# Patient Record
Sex: Male | Born: 1950 | Race: White | Hispanic: No | Smoking: Current every day smoker
Health system: Southern US, Community
[De-identification: ages and names within clinical notes are randomized; demographics above are authoritative.]

## PROBLEM LIST (undated history)

## (undated) DIAGNOSIS — I509 Heart failure, unspecified: Secondary | ICD-10-CM

## (undated) DIAGNOSIS — I4891 Unspecified atrial fibrillation: Secondary | ICD-10-CM

## (undated) DIAGNOSIS — I25810 Atherosclerosis of coronary artery bypass graft(s) without angina pectoris: Secondary | ICD-10-CM

## (undated) DIAGNOSIS — Z95810 Presence of automatic (implantable) cardiac defibrillator: Secondary | ICD-10-CM

## (undated) DIAGNOSIS — E785 Hyperlipidemia, unspecified: Secondary | ICD-10-CM

## (undated) DIAGNOSIS — R000 Tachycardia, unspecified: Secondary | ICD-10-CM

## (undated) DIAGNOSIS — I255 Ischemic cardiomyopathy: Secondary | ICD-10-CM

## (undated) HISTORY — PX: APPENDECTOMY: SHX54

## (undated) HISTORY — DX: Heart failure, unspecified: I50.9

## (undated) HISTORY — DX: Presence of automatic (implantable) cardiac defibrillator: Z95.810

## (undated) HISTORY — DX: Tachycardia, unspecified: R00.0

## (undated) HISTORY — PX: LAPAROTOMY: SHX154

## (undated) HISTORY — DX: Ischemic cardiomyopathy: I25.5

## (undated) HISTORY — DX: Hyperlipidemia, unspecified: E78.5

## (undated) HISTORY — DX: Unspecified atrial fibrillation: I48.91

## (undated) HISTORY — DX: Atherosclerosis of coronary artery bypass graft(s) without angina pectoris: I25.810

---

## 1998-09-13 ENCOUNTER — Encounter: Admission: RE | Admit: 1998-09-13 | Discharge: 1998-12-12 | Payer: Self-pay | Admitting: *Deleted

## 2001-07-20 ENCOUNTER — Encounter: Admission: RE | Admit: 2001-07-20 | Discharge: 2001-07-20 | Payer: Self-pay | Admitting: Internal Medicine

## 2001-07-20 ENCOUNTER — Encounter: Payer: Self-pay | Admitting: Internal Medicine

## 2002-07-03 ENCOUNTER — Encounter (HOSPITAL_COMMUNITY): Admission: RE | Admit: 2002-07-03 | Discharge: 2002-10-01 | Payer: Self-pay | Admitting: Internal Medicine

## 2002-07-04 ENCOUNTER — Encounter: Payer: Self-pay | Admitting: Internal Medicine

## 2005-05-17 HISTORY — PX: CORONARY ARTERY BYPASS GRAFT: SHX141

## 2005-06-06 ENCOUNTER — Other Ambulatory Visit: Payer: Self-pay

## 2005-06-07 ENCOUNTER — Other Ambulatory Visit: Payer: Self-pay

## 2005-06-07 ENCOUNTER — Inpatient Hospital Stay: Payer: Self-pay | Admitting: Internal Medicine

## 2005-06-09 ENCOUNTER — Ambulatory Visit: Payer: Self-pay | Admitting: Cardiology

## 2005-06-09 ENCOUNTER — Encounter (INDEPENDENT_AMBULATORY_CARE_PROVIDER_SITE_OTHER): Payer: Self-pay | Admitting: *Deleted

## 2005-06-09 ENCOUNTER — Encounter: Payer: Self-pay | Admitting: Cardiology

## 2005-06-09 ENCOUNTER — Inpatient Hospital Stay (HOSPITAL_COMMUNITY)
Admission: AD | Admit: 2005-06-09 | Discharge: 2005-06-24 | Payer: Self-pay | Admitting: Thoracic Surgery (Cardiothoracic Vascular Surgery)

## 2005-06-23 ENCOUNTER — Encounter: Payer: Self-pay | Admitting: Cardiology

## 2005-07-02 ENCOUNTER — Ambulatory Visit: Payer: Self-pay | Admitting: Cardiovascular Disease

## 2005-07-02 ENCOUNTER — Ambulatory Visit: Payer: Self-pay | Admitting: Cardiology

## 2005-07-09 ENCOUNTER — Ambulatory Visit: Payer: Self-pay | Admitting: Cardiology

## 2005-07-10 ENCOUNTER — Ambulatory Visit: Payer: Self-pay | Admitting: Cardiology

## 2005-07-14 ENCOUNTER — Ambulatory Visit: Payer: Self-pay | Admitting: Cardiovascular Disease

## 2005-07-14 ENCOUNTER — Ambulatory Visit (HOSPITAL_COMMUNITY): Admission: RE | Admit: 2005-07-14 | Discharge: 2005-07-14 | Payer: Self-pay | Admitting: Cardiovascular Disease

## 2005-07-17 ENCOUNTER — Ambulatory Visit: Payer: Self-pay | Admitting: Cardiology

## 2005-07-17 ENCOUNTER — Ambulatory Visit: Payer: Self-pay | Admitting: Cardiovascular Disease

## 2005-07-30 ENCOUNTER — Encounter (HOSPITAL_COMMUNITY): Admission: RE | Admit: 2005-07-30 | Discharge: 2005-10-28 | Payer: Self-pay | Admitting: Cardiovascular Disease

## 2005-08-11 ENCOUNTER — Ambulatory Visit: Payer: Self-pay | Admitting: Cardiovascular Disease

## 2005-08-13 ENCOUNTER — Encounter: Payer: Self-pay | Admitting: Cardiology

## 2005-08-13 ENCOUNTER — Ambulatory Visit (HOSPITAL_COMMUNITY): Admission: RE | Admit: 2005-08-13 | Discharge: 2005-08-13 | Payer: Self-pay | Admitting: Cardiovascular Disease

## 2005-09-04 ENCOUNTER — Ambulatory Visit: Payer: Self-pay | Admitting: Internal Medicine

## 2005-09-15 ENCOUNTER — Ambulatory Visit: Payer: Self-pay | Admitting: Cardiology

## 2005-09-16 HISTORY — PX: OTHER SURGICAL HISTORY: SHX169

## 2005-09-21 ENCOUNTER — Ambulatory Visit: Payer: Self-pay | Admitting: Internal Medicine

## 2005-09-21 ENCOUNTER — Ambulatory Visit (HOSPITAL_COMMUNITY): Admission: RE | Admit: 2005-09-21 | Discharge: 2005-09-22 | Payer: Self-pay | Admitting: Internal Medicine

## 2005-10-01 ENCOUNTER — Ambulatory Visit: Payer: Self-pay | Admitting: Cardiology

## 2005-10-01 ENCOUNTER — Ambulatory Visit: Payer: Self-pay | Admitting: Cardiovascular Disease

## 2005-10-12 ENCOUNTER — Ambulatory Visit: Payer: Self-pay

## 2005-10-29 ENCOUNTER — Encounter (HOSPITAL_COMMUNITY): Admission: RE | Admit: 2005-10-29 | Discharge: 2006-01-27 | Payer: Self-pay | Admitting: Cardiovascular Disease

## 2006-01-05 ENCOUNTER — Ambulatory Visit: Payer: Self-pay | Admitting: Internal Medicine

## 2006-01-14 ENCOUNTER — Ambulatory Visit: Payer: Self-pay | Admitting: Cardiovascular Disease

## 2006-04-06 ENCOUNTER — Ambulatory Visit: Payer: Self-pay | Admitting: Internal Medicine

## 2006-04-30 ENCOUNTER — Ambulatory Visit: Payer: Self-pay | Admitting: Cardiovascular Disease

## 2006-06-11 ENCOUNTER — Ambulatory Visit: Payer: Self-pay | Admitting: Internal Medicine

## 2006-06-11 ENCOUNTER — Ambulatory Visit: Payer: Self-pay | Admitting: Cardiology

## 2006-06-14 ENCOUNTER — Ambulatory Visit: Payer: Self-pay | Admitting: Cardiology

## 2006-06-14 ENCOUNTER — Ambulatory Visit: Payer: Self-pay | Admitting: Internal Medicine

## 2006-06-16 ENCOUNTER — Ambulatory Visit: Payer: Self-pay | Admitting: Cardiology

## 2006-06-16 ENCOUNTER — Ambulatory Visit: Payer: Self-pay | Admitting: Internal Medicine

## 2006-06-16 LAB — CONVERTED CEMR LAB
Basophils Absolute: 0 10*3/uL (ref 0.0–0.1)
Basophils Relative: 0.2 % (ref 0.0–1.0)
Calcium: 9.3 mg/dL (ref 8.4–10.5)
Creatinine, Ser: 1.1 mg/dL (ref 0.4–1.5)
GFR calc Af Amer: 89 mL/min
INR: 1.2 (ref 0.9–2.0)
Lymphocytes Relative: 14.6 % (ref 12.0–46.0)
Monocytes Relative: 4.9 % (ref 3.0–11.0)
Platelets: 307 10*3/uL (ref 150–400)
RBC: 4.55 M/uL (ref 4.22–5.81)
WBC: 14.9 10*3/uL — ABNORMAL HIGH (ref 4.5–10.5)

## 2006-06-18 ENCOUNTER — Encounter: Payer: Self-pay | Admitting: Internal Medicine

## 2006-06-18 ENCOUNTER — Ambulatory Visit: Payer: Self-pay | Admitting: Internal Medicine

## 2006-06-18 ENCOUNTER — Ambulatory Visit (HOSPITAL_COMMUNITY): Admission: RE | Admit: 2006-06-18 | Discharge: 2006-06-19 | Payer: Self-pay | Admitting: Internal Medicine

## 2006-06-22 ENCOUNTER — Ambulatory Visit: Payer: Self-pay | Admitting: Internal Medicine

## 2006-07-01 ENCOUNTER — Ambulatory Visit: Payer: Self-pay | Admitting: Cardiovascular Disease

## 2006-07-14 ENCOUNTER — Ambulatory Visit: Payer: Self-pay | Admitting: Internal Medicine

## 2006-08-09 ENCOUNTER — Ambulatory Visit: Payer: Self-pay | Admitting: Cardiovascular Disease

## 2006-08-17 ENCOUNTER — Ambulatory Visit: Payer: Self-pay | Admitting: Cardiology

## 2006-08-24 IMAGING — CR DG CHEST 1V PORT
1 series · 1 of 1 positions shown · non-contrast
Comparison: 06/12/05
 AP film at 0090 hours shows interval removal of the left chest tube, the mediastinal drains, the nasogastric tube, and the endotracheal tube, and the pulmonary artery catheter.

CLINICAL DATA: CABG. 
 PORTABLE CHEST- 1 VIEW:

[view not recorded]
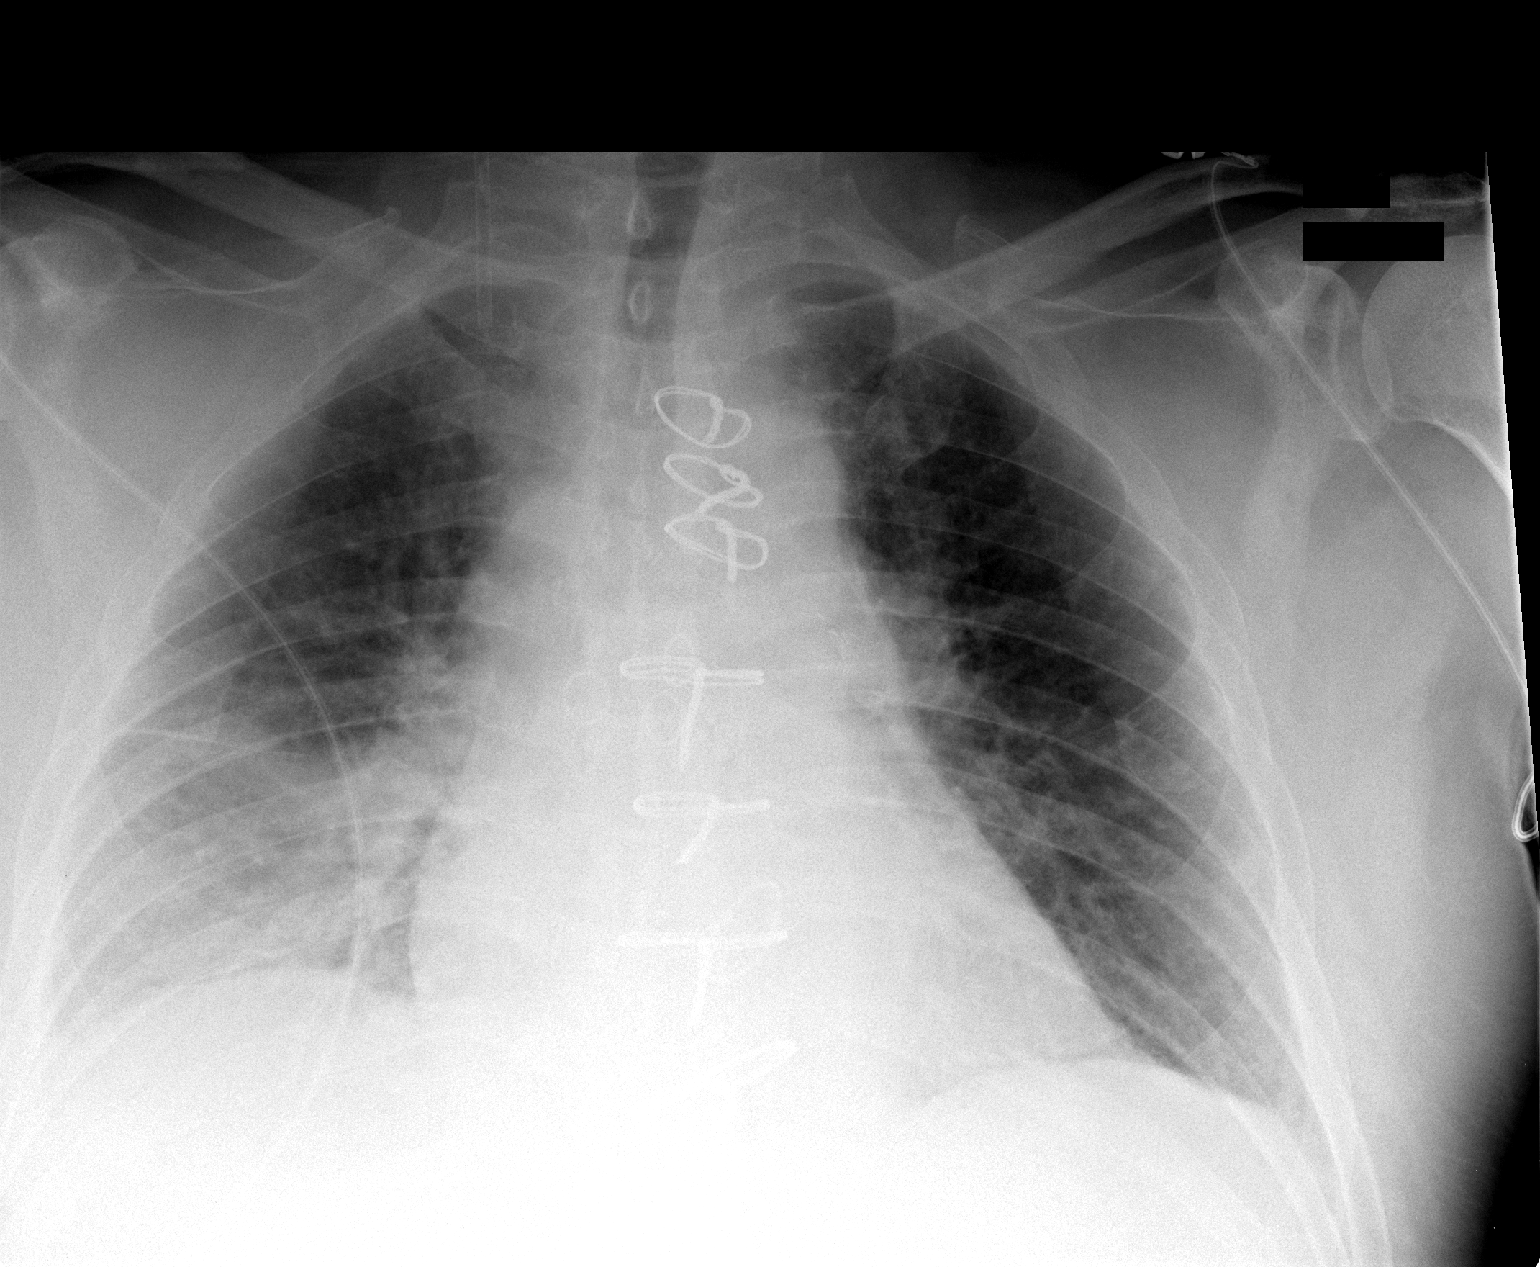

[1 of 1 positions shown; findings below may reference images not displayed]

Right IJ sheath remains in place and may be kinked.  
 Cardiopericardial silhouette is stable.  There is atelectasis at the right base.
IMPRESSION: 1.  Status post extubation with removal of multiple support apparatus.  
 2.  Right base atelectasis.

## 2006-09-03 ENCOUNTER — Ambulatory Visit: Payer: Self-pay | Admitting: Internal Medicine

## 2006-09-24 ENCOUNTER — Ambulatory Visit: Payer: Self-pay | Admitting: Cardiology

## 2006-10-13 ENCOUNTER — Ambulatory Visit: Payer: Self-pay | Admitting: Internal Medicine

## 2006-11-10 ENCOUNTER — Ambulatory Visit: Payer: Self-pay | Admitting: Cardiology

## 2006-12-01 ENCOUNTER — Ambulatory Visit: Payer: Self-pay | Admitting: Cardiology

## 2006-12-07 ENCOUNTER — Ambulatory Visit: Payer: Self-pay | Admitting: Internal Medicine

## 2006-12-29 ENCOUNTER — Ambulatory Visit: Payer: Self-pay | Admitting: Cardiology

## 2007-01-26 ENCOUNTER — Ambulatory Visit: Payer: Self-pay | Admitting: Cardiology

## 2007-03-02 ENCOUNTER — Ambulatory Visit: Payer: Self-pay | Admitting: Internal Medicine

## 2007-03-30 ENCOUNTER — Ambulatory Visit: Payer: Self-pay | Admitting: Cardiovascular Disease

## 2007-05-04 ENCOUNTER — Ambulatory Visit: Payer: Self-pay | Admitting: Cardiology

## 2007-06-01 ENCOUNTER — Ambulatory Visit: Payer: Self-pay | Admitting: Internal Medicine

## 2007-06-30 ENCOUNTER — Ambulatory Visit: Payer: Self-pay | Admitting: Cardiology

## 2007-07-08 ENCOUNTER — Ambulatory Visit: Payer: Self-pay | Admitting: Internal Medicine

## 2007-07-20 ENCOUNTER — Ambulatory Visit: Payer: Self-pay | Admitting: Cardiovascular Disease

## 2007-07-20 ENCOUNTER — Ambulatory Visit (HOSPITAL_COMMUNITY): Admission: RE | Admit: 2007-07-20 | Discharge: 2007-07-20 | Payer: Self-pay | Admitting: Cardiovascular Disease

## 2007-08-10 ENCOUNTER — Ambulatory Visit: Payer: Self-pay | Admitting: Cardiovascular Disease

## 2007-09-06 ENCOUNTER — Ambulatory Visit: Payer: Self-pay | Admitting: Internal Medicine

## 2007-09-06 ENCOUNTER — Ambulatory Visit: Payer: Self-pay | Admitting: Cardiology

## 2007-10-04 ENCOUNTER — Ambulatory Visit: Payer: Self-pay | Admitting: Cardiology

## 2007-10-26 ENCOUNTER — Ambulatory Visit: Payer: Self-pay | Admitting: Cardiovascular Disease

## 2007-11-23 ENCOUNTER — Ambulatory Visit: Payer: Self-pay | Admitting: Cardiology

## 2007-12-06 ENCOUNTER — Ambulatory Visit: Payer: Self-pay | Admitting: Internal Medicine

## 2007-12-21 ENCOUNTER — Ambulatory Visit: Payer: Self-pay | Admitting: Cardiology

## 2008-01-03 ENCOUNTER — Ambulatory Visit: Payer: Self-pay | Admitting: Cardiology

## 2008-01-03 ENCOUNTER — Ambulatory Visit: Payer: Self-pay | Admitting: Cardiovascular Disease

## 2008-01-03 LAB — CONVERTED CEMR LAB
AST: 19 units/L (ref 0–37)
Albumin: 3.9 g/dL (ref 3.5–5.2)
BUN: 16 mg/dL (ref 6–23)
Chloride: 102 meq/L (ref 96–112)
GFR calc non Af Amer: 73 mL/min
Potassium: 4.5 meq/L (ref 3.5–5.1)
Sodium: 139 meq/L (ref 135–145)
TSH: 0.31 microintl units/mL — ABNORMAL LOW (ref 0.35–5.50)
Total Bilirubin: 0.7 mg/dL (ref 0.3–1.2)
Total Protein: 7.8 g/dL (ref 6.0–8.3)

## 2008-01-10 ENCOUNTER — Ambulatory Visit: Admission: RE | Admit: 2008-01-10 | Discharge: 2008-01-10 | Payer: Self-pay | Admitting: Cardiovascular Disease

## 2008-01-31 ENCOUNTER — Ambulatory Visit: Payer: Self-pay | Admitting: Cardiology

## 2008-02-28 ENCOUNTER — Ambulatory Visit: Payer: Self-pay | Admitting: Cardiology

## 2008-04-04 ENCOUNTER — Ambulatory Visit: Payer: Self-pay | Admitting: Cardiovascular Disease

## 2008-04-04 ENCOUNTER — Ambulatory Visit: Payer: Self-pay | Admitting: Cardiology

## 2008-04-04 LAB — CONVERTED CEMR LAB: TSH: 0.46 microintl units/mL (ref 0.35–5.50)

## 2008-04-24 ENCOUNTER — Encounter: Payer: Self-pay | Admitting: Internal Medicine

## 2008-05-01 ENCOUNTER — Ambulatory Visit: Payer: Self-pay | Admitting: Internal Medicine

## 2008-05-02 ENCOUNTER — Ambulatory Visit: Payer: Self-pay | Admitting: Cardiology

## 2008-05-30 ENCOUNTER — Ambulatory Visit: Payer: Self-pay | Admitting: Cardiovascular Disease

## 2008-06-12 ENCOUNTER — Encounter: Payer: Self-pay | Admitting: Cardiovascular Disease

## 2008-06-13 ENCOUNTER — Encounter: Payer: Self-pay | Admitting: Cardiovascular Disease

## 2008-06-19 ENCOUNTER — Encounter: Payer: Self-pay | Admitting: Cardiovascular Disease

## 2008-06-20 ENCOUNTER — Encounter: Payer: Self-pay | Admitting: Cardiovascular Disease

## 2008-06-21 ENCOUNTER — Encounter: Payer: Self-pay | Admitting: Cardiovascular Disease

## 2008-06-22 ENCOUNTER — Ambulatory Visit: Payer: Self-pay | Admitting: Cardiovascular Disease

## 2008-06-28 ENCOUNTER — Encounter: Payer: Self-pay | Admitting: Cardiovascular Disease

## 2008-07-02 DIAGNOSIS — I509 Heart failure, unspecified: Secondary | ICD-10-CM | POA: Insufficient documentation

## 2008-07-02 DIAGNOSIS — Z95810 Presence of automatic (implantable) cardiac defibrillator: Secondary | ICD-10-CM | POA: Insufficient documentation

## 2008-07-02 DIAGNOSIS — I4891 Unspecified atrial fibrillation: Secondary | ICD-10-CM

## 2008-07-02 DIAGNOSIS — R0989 Other specified symptoms and signs involving the circulatory and respiratory systems: Secondary | ICD-10-CM | POA: Insufficient documentation

## 2008-07-02 DIAGNOSIS — M129 Arthropathy, unspecified: Secondary | ICD-10-CM | POA: Insufficient documentation

## 2008-07-02 DIAGNOSIS — I2589 Other forms of chronic ischemic heart disease: Secondary | ICD-10-CM

## 2008-07-02 DIAGNOSIS — E119 Type 2 diabetes mellitus without complications: Secondary | ICD-10-CM | POA: Insufficient documentation

## 2008-07-02 DIAGNOSIS — I25810 Atherosclerosis of coronary artery bypass graft(s) without angina pectoris: Secondary | ICD-10-CM

## 2008-07-02 DIAGNOSIS — E785 Hyperlipidemia, unspecified: Secondary | ICD-10-CM

## 2008-07-16 ENCOUNTER — Encounter: Payer: Self-pay | Admitting: Cardiovascular Disease

## 2008-07-17 ENCOUNTER — Encounter: Payer: Self-pay | Admitting: *Deleted

## 2008-07-18 ENCOUNTER — Encounter: Payer: Self-pay | Admitting: Cardiovascular Disease

## 2008-07-19 ENCOUNTER — Encounter (INDEPENDENT_AMBULATORY_CARE_PROVIDER_SITE_OTHER): Payer: Self-pay | Admitting: *Deleted

## 2008-07-23 ENCOUNTER — Ambulatory Visit: Payer: Self-pay | Admitting: Cardiovascular Disease

## 2008-07-23 LAB — CONVERTED CEMR LAB
POC INR: 2
Protime: 17.6

## 2008-07-29 ENCOUNTER — Encounter: Payer: Self-pay | Admitting: Cardiovascular Disease

## 2008-08-13 ENCOUNTER — Ambulatory Visit: Payer: Self-pay | Admitting: Cardiovascular Disease

## 2008-08-13 LAB — CONVERTED CEMR LAB
POC INR: 1.3
Prothrombin Time: 14.4 s

## 2008-08-22 ENCOUNTER — Encounter: Payer: Self-pay | Admitting: *Deleted

## 2008-08-24 ENCOUNTER — Ambulatory Visit: Payer: Self-pay | Admitting: Cardiology

## 2008-08-24 ENCOUNTER — Encounter (INDEPENDENT_AMBULATORY_CARE_PROVIDER_SITE_OTHER): Payer: Self-pay | Admitting: Cardiology

## 2008-08-24 LAB — CONVERTED CEMR LAB: POC INR: 2

## 2008-08-29 ENCOUNTER — Encounter: Payer: Self-pay | Admitting: Cardiovascular Disease

## 2008-09-25 ENCOUNTER — Encounter: Payer: Self-pay | Admitting: Cardiology

## 2008-09-28 ENCOUNTER — Encounter: Payer: Self-pay | Admitting: Cardiovascular Disease

## 2008-10-03 ENCOUNTER — Encounter (INDEPENDENT_AMBULATORY_CARE_PROVIDER_SITE_OTHER): Payer: Self-pay | Admitting: *Deleted

## 2008-10-15 ENCOUNTER — Ambulatory Visit: Payer: Self-pay | Admitting: Cardiovascular Disease

## 2008-10-18 ENCOUNTER — Encounter: Payer: Self-pay | Admitting: Cardiovascular Disease

## 2008-10-19 ENCOUNTER — Encounter (INDEPENDENT_AMBULATORY_CARE_PROVIDER_SITE_OTHER): Payer: Self-pay | Admitting: *Deleted

## 2008-10-19 LAB — CONVERTED CEMR LAB
ALT: 23 units/L (ref 0–53)
AST: 18 units/L (ref 0–37)
Albumin: 3.6 g/dL (ref 3.5–5.2)
CO2: 28 meq/L (ref 19–32)
Calcium: 8.8 mg/dL (ref 8.4–10.5)
Chloride: 109 meq/L (ref 96–112)
Creatinine, Ser: 1 mg/dL (ref 0.4–1.5)
Free T4: 1.4 ng/dL (ref 0.6–1.6)
Glucose, Bld: 59 mg/dL — ABNORMAL LOW (ref 70–99)
Potassium: 4.1 meq/L (ref 3.5–5.1)
Pro B Natriuretic peptide (BNP): 350 pg/mL — ABNORMAL HIGH (ref 0.0–100.0)

## 2008-10-22 ENCOUNTER — Encounter: Payer: Self-pay | Admitting: Cardiovascular Disease

## 2008-10-30 ENCOUNTER — Ambulatory Visit: Payer: Self-pay | Admitting: Cardiology

## 2008-10-30 LAB — CONVERTED CEMR LAB: POC INR: 2.3

## 2008-11-05 ENCOUNTER — Ambulatory Visit: Payer: Self-pay

## 2008-11-05 ENCOUNTER — Ambulatory Visit: Payer: Self-pay | Admitting: Cardiovascular Disease

## 2008-11-05 ENCOUNTER — Encounter: Payer: Self-pay | Admitting: Cardiovascular Disease

## 2008-11-08 ENCOUNTER — Encounter (INDEPENDENT_AMBULATORY_CARE_PROVIDER_SITE_OTHER): Payer: Self-pay | Admitting: *Deleted

## 2008-11-20 ENCOUNTER — Encounter: Payer: Self-pay | Admitting: Cardiovascular Disease

## 2008-11-27 ENCOUNTER — Ambulatory Visit: Payer: Self-pay | Admitting: Cardiology

## 2008-11-27 LAB — CONVERTED CEMR LAB: POC INR: 1.8

## 2008-12-09 ENCOUNTER — Encounter: Payer: Self-pay | Admitting: Cardiovascular Disease

## 2008-12-16 ENCOUNTER — Encounter: Payer: Self-pay | Admitting: Cardiovascular Disease

## 2008-12-17 ENCOUNTER — Encounter: Payer: Self-pay | Admitting: Cardiovascular Disease

## 2009-01-13 ENCOUNTER — Encounter: Payer: Self-pay | Admitting: Cardiovascular Disease

## 2009-01-21 ENCOUNTER — Encounter: Payer: Self-pay | Admitting: Cardiovascular Disease

## 2009-02-06 ENCOUNTER — Encounter: Payer: Self-pay | Admitting: Internal Medicine

## 2009-02-12 ENCOUNTER — Encounter (INDEPENDENT_AMBULATORY_CARE_PROVIDER_SITE_OTHER): Payer: Self-pay | Admitting: *Deleted

## 2009-02-12 ENCOUNTER — Ambulatory Visit: Payer: Self-pay | Admitting: Cardiovascular Disease

## 2009-03-13 ENCOUNTER — Encounter (INDEPENDENT_AMBULATORY_CARE_PROVIDER_SITE_OTHER): Payer: Self-pay | Admitting: *Deleted

## 2009-03-14 ENCOUNTER — Ambulatory Visit (HOSPITAL_COMMUNITY): Admission: RE | Admit: 2009-03-14 | Discharge: 2009-03-14 | Payer: Self-pay | Admitting: Gastroenterology

## 2009-04-05 ENCOUNTER — Telehealth (INDEPENDENT_AMBULATORY_CARE_PROVIDER_SITE_OTHER): Payer: Self-pay | Admitting: *Deleted

## 2009-04-09 ENCOUNTER — Encounter (INDEPENDENT_AMBULATORY_CARE_PROVIDER_SITE_OTHER): Payer: Self-pay | Admitting: *Deleted

## 2009-07-08 ENCOUNTER — Encounter (INDEPENDENT_AMBULATORY_CARE_PROVIDER_SITE_OTHER): Payer: Self-pay | Admitting: Pharmacist

## 2009-07-17 ENCOUNTER — Encounter: Payer: Self-pay | Admitting: Cardiovascular Disease

## 2009-07-22 ENCOUNTER — Encounter: Payer: Self-pay | Admitting: Cardiovascular Disease

## 2009-09-03 ENCOUNTER — Encounter: Payer: Self-pay | Admitting: Cardiology

## 2009-09-27 ENCOUNTER — Encounter (INDEPENDENT_AMBULATORY_CARE_PROVIDER_SITE_OTHER): Payer: Self-pay | Admitting: *Deleted

## 2009-10-03 ENCOUNTER — Encounter: Payer: Self-pay | Admitting: Internal Medicine

## 2010-03-03 ENCOUNTER — Telehealth: Payer: Self-pay | Admitting: Internal Medicine

## 2010-03-03 ENCOUNTER — Ambulatory Visit
Admission: RE | Admit: 2010-03-03 | Discharge: 2010-03-03 | Payer: Self-pay | Source: Home / Self Care | Attending: Internal Medicine | Admitting: Internal Medicine

## 2010-03-03 ENCOUNTER — Encounter: Payer: Self-pay | Admitting: Internal Medicine

## 2010-03-16 LAB — CONVERTED CEMR LAB
POC INR: 1.8
Potassium: 4.1 meq/L (ref 3.5–5.1)
Sodium: 139 meq/L (ref 135–145)

## 2010-03-18 NOTE — Letter (Signed)
Summary: [facility] - Delinquent Coumadin 1  Coumadin  [address]   [place], [place] [address]   Phone: [phone]  Fax: [phone]     Jul 08, 2009 MRN: [id number]   [facility] [address] [place], [place]  [address]   Dear [name],  This letter is being sent to you as a reminder that it is necessary for you to get your INR/PT checked regularly so that we can optimize your care.  Our records indicate that you were scheduled to have a test done recently.  As of today, we have not received the results of this test.  It is very important that you have your INR checked.  Please call our office at the number listed above to schedule an appointment at your earliest convenience.    If you have recently had your protime checked or have discontinued this medication, please contact our office at the above phone number to clarify this issue.  Thank you for this prompt attention to this important health care matter.  Sincerely,   [facility] Cardi[facility] Reduction [facility] Team

## 2010-03-18 NOTE — Medication Information (Signed)
Summary: Coumadin Clinic  Anticoagulant Therapy  Managed by: Inactive Referring MD: Charlton Haws MD Supervising MD: Jens Som MD, Arlys John Indication 1: Atrial Flutter (ICD-427.32) Lab Used: [facility] Site: [facility] INR RANGE 2 - 3          Comments: Called spoke with pt. Pt states Dr Laural Benes in [place] is now monitoring pt's coumadin. Cloyde Reams RN  September 03, 2009 9:02 AM   Allergies: No Known Drug Allergies  Anticoagulation Management History:      Positive risk factors for bleeding include presence of serious comorbidities.  Negative risk factors for bleeding include an age less than 20 years old.  The bleeding index is 'intermediate risk'.  Positive CHADS2 values include History of CHF and History of Diabetes.  Negative CHADS2 values include Age > 6 years old.  The start date was 06/07/2006.  His last INR was 1.2 RATIO.  Anticoagulation responsible provider: Jens Som MD, Arlys John.  Exp: 03/2010.    Anticoagulation Management Assessment/Plan:      The patient's current anticoagulation dose is Coumadin 5 mg tabs: Use as directed by anticoagulation clinic.  The target INR is 2 - 3.  The next INR is due 03/05/2009.  Anticoagulation instructions were given to patient .  Results were reviewed/authorized by Inactive.         Prior Anticoagulation Instructions: INR = 2.1  The patient is to continue with the same dose of coumadin.  This dosage includes: 2 tablets (10mg ) every day except 1.5 tablets (7.5mg ) on Thursdays.  TAKE BOTH TABLETS AT THE SAME TIME IN THE EVENINGS.

## 2010-03-18 NOTE — Letter (Signed)
Summary: Appointment - Reminder 2  [facility], Main Office  [address]   [place], [place] [address]   Phone: [phone]  Fax: [phone]     March 13, 2009 MRN: [id number]   [name] [address] [place], [place]  [address]   Dear Mr. Barillas,  Our records indicate that it is time to schedule a follow-up appointment with Dr. Eden Emms. It is very important that we reach you to schedule this appointment. We look forward to participating in your health care needs. Please contact us at the number listed above at your earliest convenience to schedule your appointment.  If you are unable to make an appointment at this time, give [place] a call so we can update our records.   Sincerely,   [facility] Scheduling Team

## 2010-03-18 NOTE — Letter (Signed)
Summary: [employer] - Heart Failure Program  [employer] - Heart Failure Program   Imported By: Marylou Mccoy 08/10/2009 09:16:52  _____________________________________________________________________  External Attachment:    Type:   Image     Comment:   External Document

## 2010-03-18 NOTE — Progress Notes (Signed)
Summary: refill meds   Phone Note Refill Request Call back at Home Phone [phone] Message from:  Patient on April 05, 2009 10:41 AM  Refills Requested: Medication #1:  ALDACTONE 25 MG TABS 1 two times a day. [facility] [address] in [place].    Method Requested: Fax to Local Pharmacy Initial call taken by: Lorne Skeens,  April 05, 2009 10:42 AM  Follow-up for Phone Call        Rx faxed to pharmacy Follow-up by: Oswald Hillock,  April 05, 2009 11:10 AM    Prescriptions: ALDACTONE 25 MG TABS (SPIRONOLACTONE) 1 two times a day  #180 x 3   Entered by:   Oswald Hillock   Authorized by:   Colon Branch, MD, [facility]   Signed by:   Oswald Hillock on 04/05/2009   Method used:   Electronically to        [facility] (retail)       [address]       [place], [place]  [address]       Ph: [phone]       Fax: [phone]   RxID:   [id number]

## 2010-03-18 NOTE — Letter (Signed)
Summary: [employer] - Heart Failure Program  [employer] - Heart Failure Program   Imported By: Marylou Mccoy 08/09/2009 17:56:33  _____________________________________________________________________  External Attachment:    Type:   Image     Comment:   External Document

## 2010-03-18 NOTE — Consult Note (Signed)
Summary: [facility]   Imported By: Marylou Mccoy 03/07/2009 13:17:57  _____________________________________________________________________  External Attachment:    Type:   Image     Comment:   External Document

## 2010-03-18 NOTE — Letter (Signed)
Summary: Appointment - Missed  [facility], Main Office  [address]   [place], [place] [address]   Phone: [phone]  Fax: [phone]     April 09, 2009 MRN: [id number]   [facility] [address] [place], [place]  [address]   Dear [name],  Our records indicate you missed your appointment on 03/26/09 with Dr. [name]. It is very important that we reach you to reschedule this appointment. We look forward to participating in your health care needs. Please contact us at the number listed above at your earliest convenience to reschedule this appointment.     Sincerely,   [facility] Scheduling Team

## 2010-03-18 NOTE — Letter (Signed)
Summary: Device-Delinquent Check  [facility], Main Office  [address]   [place], [place] [address]   Phone: [phone]  Fax: [phone]     September 27, 2009 MRN: [id number]   [facility] [address] [place], [place]  [address]   Dear Mr. Melody,  According to our records, you have not had your implanted device checked in the recommended period of time.  We are unable to determine appropriate device function without checking your device on a regular basis.  Please call our office to schedule an appointment, with Dr. [name],  as soon as possible.  If you are having your device checked by another physician, please call us so that we may update our records.  Thank you,  Altha Harm, LPN  September 27, 2009 3:29 PM  [facility]  certified mail

## 2010-03-18 NOTE — Cardiovascular Report (Signed)
Summary: Certified Letter Delivered  Certified Letter Delivered   Imported By: Debby Freiberg 12/17/2009 11:20:10  _____________________________________________________________________  External Attachment:    Type:   Image     Comment:   External Document

## 2010-03-20 NOTE — Progress Notes (Addendum)
   Phone Note From Other Clinic   Summary of Call: PER KATY AT [facility]---INTERROGATION AT THEIR OFC.  PT HASNT HAD CHECK.  SPOKE W/PT'S DAUGHTER ---TO SEND [facility] TRANSMISSION AND THEN PT TO BE SCHEDULED IN 3 MTHS W/GT. Vella Kohler  March 03, 2010 11:55 AM

## 2010-03-26 ENCOUNTER — Encounter (INDEPENDENT_AMBULATORY_CARE_PROVIDER_SITE_OTHER): Payer: Self-pay | Admitting: *Deleted

## 2010-04-03 NOTE — Cardiovascular Report (Signed)
Summary: Office Visit Remote   Office Visit Remote   Imported By: Roderic Ovens 03/27/2010 15:29:43  _____________________________________________________________________  External Attachment:    Type:   Image     Comment:   External Document

## 2010-04-03 NOTE — Letter (Signed)
Summary: Remote Device Check  [facility], Main Office  [address]   [place], [place] [address]   Phone: [phone]  Fax: [phone]     March 26, 2010 MRN: [id number]   [facility] [address] [place], [place]  [address]   Dear Mr. Breeding,   Your remote transmission was recieved and reviewed by your physician.  All diagnostics were within normal limits for you.  __X____Your next office visit is scheduled for:  April 2012 with Dr [name] that is overdue. Please call our office to schedule an appointment.    Sincerely,  Vella Kohler

## 2010-05-05 LAB — GLUCOSE, CAPILLARY: Glucose-Capillary: 261 mg/dL — ABNORMAL HIGH (ref 70–99)

## 2010-05-19 ENCOUNTER — Ambulatory Visit (INDEPENDENT_AMBULATORY_CARE_PROVIDER_SITE_OTHER): Payer: 59 | Admitting: *Deleted

## 2010-05-19 ENCOUNTER — Encounter: Payer: 59 | Admitting: *Deleted

## 2010-05-19 DIAGNOSIS — I428 Other cardiomyopathies: Secondary | ICD-10-CM

## 2010-05-20 ENCOUNTER — Encounter: Payer: 59 | Admitting: Internal Medicine

## 2010-07-01 NOTE — Assessment & Plan Note (Signed)
[facility]                         ELECTROPHYSIOLOGY OFFICE NOTE   KIMBER, ESTERLY                       MRN:          [id number]  DATE:09/06/2007                            DOB:          1950/03/22    Mr. Rosner returns today for followup.  He is a very pleasant middle-  aged male with an ischemic cardiomyopathy, congestive heart failure, and  severe arthritis who returns today for followup.  He has no intercurrent  ICD therapies.  He denies chest pain.  His main complaint is of  arthritic complaints of his hands and his knees.   Medications include,  1. Coumadin as directed.  2. Carvedilol 25 two tablets in the morning and 1 tablet in the      evening.  3. Diclofenac (he is unsure whether he has taken this or not).  4. Aspirin 81 a day.  5. Glyburide 5 mg 2 tablets twice daily.  6. Furosemide 40 mg a day.  7. Potassium 20 a day.  8. Lantus as directed.  9. Lisinopril 10 a day.  10.Glucophage 500 twice daily.  11.Cordarone 200 a day.   On physical exam,  GENERAL:  He is a pleasant middle-aged man in no acute distress.  VITAL SIGNS:  Blood pressure today was 107/70, the pulse of 75 and  regular, the respirations were 18, and the weight was 259 pounds.  NECK:  No jugular venous distention.  LUNGS:  Clear bilaterally to auscultation.  No wheezes, rales, or  rhonchi were present.  CARDIOVASCULAR:  Regular rate and rhythm, normal S1 and S2.  EXTREMITIES:  No edema.   Interrogation of his defibrillator demonstrates Medtronic Maximo.  The R-  waves were 14.  The impedance was 552.  The threshold was 0.5 volt.  Battery voltage was 3.16 volts.  There were no intercurrent ICD  therapies.  He was not V-pacing at all.   IMPRESSION:  1. Ischemic cardiomyopathy.  2. Congestive heart failure.  3. Status post ICD insertion.  4. Severe arthritis.   DISCUSSION:  Mr. Nixon is stable from a cardiovascular perspective.  His main complaint is  arthritis.  He will see [place] back in the office for  a follow up in 1 year.     [name]. [name], MD  Electronically Signed    GWT/MedQ  DD: 09/06/2007  DT: 09/07/2007  Job #: [id number]

## 2010-07-01 NOTE — Assessment & Plan Note (Signed)
[name]                         ELECTROPHYSIOLOGY OFFICE NOTE   Kyle, Fernandez                       MRN:          [id number]  DATE:09/03/2006                            DOB:          May 12, 1950    [name] returns today for followup.  He is a very pleasant middle-  aged man with an ischemic cardiomyopathy and LV dysfunction, status post  bypass surgery, history of atrial flutter, status post ablation, who has  subsequently developed a single episode of atrial fibrillation and is  now back on Coumadin therapy.  He returns today for followup and overall  has felt well, denying any chest pain or shortness of breath.  He is  back on Coumadin without particular difficulty.   PHYSICAL EXAMINATION:  On physical exam, he is a pleasant, well-  appearing man in no distress.  Blood pressure was 128/68.  The pulse 70 and regular.  Respirations are  18.  Weight was 265 pounds.  NECK:  No jugular venous distention.  LUNGS:  Clear bilaterally to auscultation.  No wheezes, rales or rhonchi  are present.   Medications include aspirin 81 a day, Coreg 12.5 twice daily, glyburide  5 twice daily, furosemide 40 a day, potassium, Lantus insulin as  directed, lisinopril 10 a day, Glucophage 500 twice daily, Cordarone 200  a day, Coumadin as directed, Allopurinol, simvastatin.   IMPRESSION:  1. Ischemic cardiomyopathy.  2. Congestive heart failure, class II.  3. Atrial flutter, status post ablation.  4. Atrial fibrillation.  5. Hypertension.  6. Chronic Coumadin therapy.   DISCUSSION:  Overall, [name] is stable.  Interrogation of his  defibrillator today demonstrates no episodes of VT and normal device  function with R waves of 12, a threshold of 1 at 0.6 and a pacing  impedance of 544 ohm.  We will plan to see the patient back in the  office in one year.     [name]. [name], MD  Electronically Signed    GWT/MedQ  DD: 09/03/2006  DT:  09/03/2006  Job #: [id number]   cc:   Candyce Churn, M.D.

## 2010-07-01 NOTE — Assessment & Plan Note (Signed)
[facility]                            CARDIOLOGY OFFICE NOTE   [name], [name]                       MRN:          [id number]  DATE:06/11/2006                            DOB:          07-25-1950    PRIMARY CARDIOLOGIST:  Dr. Charlton Haws.   REASON FOR VISIT:  Referral for evaluation by Dr. Johnella Moloney for  possible dysrhythmia.   HISTORY OF PRESENT ILLNESS:  [name] is a pleasant gentleman recently  seen in March by Dr. Eden Emms with a history of ischemic cardiomyopathy  status post single-chamber defibrillator placement, coronary artery  disease status post coronary artery bypass grafting, perioperative  atrial fibrillation on chronic amiodarone, and type 2 diabetes mellitus.  His ejection fraction has been in the 20-30% range and overall he has  done well. He was taken off of amiodarone following his last visit with  Dr. Eden Emms as he had had no obvious recurrent atrial arrhythmias. He was  seen recently by Dr. Kevan Ny with a possible viral syndrome  (sinusitis/bronchitis) and was noted to be tachycardic. His  electrocardiogram available from that visit and also today's visit  suggests an atypical atrial flutter or atrial tachycardia with 2:1 block  and heart rates around 115 beats per minute. [name] denies being in  any distress, not even necessarily sensing palpitations although he does  seem to recall his heart rate increasing a few days ago. He has  otherwise been able to tolerate the usual activity and has had no  angina. His device was interrogated today and he has had no significant  discharges. His heart rate variability is consistent with atrial flutter  beginning a few days ago.   ALLERGIES:  No known drug allergies.   CURRENT MEDICATIONS:  1. Aspirin 81 mg p.o. daily.  2. Coreg 12.5 mg p.o. b.i.d.  3. Simvastatin 20 mg p.o. daily.  4. Glyburide 5 mg p.o. b.i.d.  5. Lasix 40 mg p.o. daily.  6. Klor-Con 20 mEq p.o.  daily.  7. Lantus as directed.  8. Glucophage 500 mg p.o. b.i.d.  9. Lisinopril 10 mg p.o. daily.   REVIEW OF SYSTEMS:  As described in the history of present illness.   PHYSICAL EXAMINATION:  VITAL SIGNS:  Blood pressure is 113/77, heart  rate is 115 beats per minute. Weight is 265 pounds.  GENERAL:  The patient is comfortable and in no acute distress.  HEENT:  Conjunctiva is normal. Oropharynx is clear.  NECK:  Supple. No elevated jugular venous pressure or loud bruits. No  thyromegaly is noted.  LUNGS:  Clear. Diminished breath sounds, no wheezing or rhonchi.  CARDIAC:  Reveals a regular rate and rhythm, no S3 gallop.  ABDOMEN:  Soft, nontender, normal active bowel sounds.  EXTREMITIES:  Show no pitting edema.  SKIN:  Warm and dry.  MUSCULOSKELETAL:  No kyphosis is noted.  NEUROPSYCHIATRIC:  The patient is alert and oriented x3.   Review of old tracings shows a similar rhythm to today's from May 2007  which looks to be more of an organized atrial tachycardia rather than  atrial fibrillation.  IMPRESSION/RECOMMENDATIONS:  1. Ectopic atrial tachycardia versus atypical atrial flutter. I wonder      if this is the same rhythm that he experienced last year. He was on      amiodarone chronically although this was discontinued approximately      3 weeks ago. He has had a recent upper respiratory tract infection      which could have been a precipitant as well. He is hemodynamically      stable and not particularly symptomatic at this point. I discussed      the issues with him and our plan will be reinitiate amiodarone at      200 mg p.o. b.i.d. and also initiate Coumadin. We will then have      him followup with Dr. Graciela Husbands on Monday for further recommendations.      It is not entirely clear to me that this would be an ablatable      rhythm and if not, further medication adjustments and or TEE      cardioversion may be required.  2. Further plans to follow.     Jonelle Sidle, MD  Electronically Signed    SGM/MedQ  DD: 06/11/2006  DT: 06/11/2006  Job #: [id number]   cc:   Noralyn Pick. Eden Emms, MD, [facility]

## 2010-07-01 NOTE — Assessment & Plan Note (Signed)
[facility]                         ELECTROPHYSIOLOGY OFFICE NOTE   ROSALIE, GELPI                       MRN:          [id number]  DATE:07/14/2006                            DOB:          1950-09-14    [name] returns today for followup. He is a very pleasant, middle-age  man with an ischemic cardiomyopathy and congestive heart failure who  developed atrial flutter several months ago, began Coumadin and was  referred for electrophysiologic study and catheter ablation earlier this  month. At that time, he was found the have typical atrial flutter and  underwent successful ablation. He returns today for followup. He has had  no recurrent palpitations and overall feels well. He wants to get off of  some of his medications.   MEDICATIONS:  Aspirin, Coreg, glyburide, furosemide 40 a day, potassium,  Lantus, lisinopril, Cordarone 200 twice a day, Coumadin as directed and  Simvastatin.   PHYSICAL EXAMINATION:  GENERAL:  He is a pleasant, well-appearing,  obese, middle-age man in no distress.  VITAL SIGNS:  Blood pressure was 112/72, the pulse 77 and regular,  respirations 18, weight was 268 pounds.  NECK:  Revealed no jugular venous distention.  LUNGS:  Clear bilaterally to auscultation. No wheezes, rales or rhonchi  were present.  CARDIOVASCULAR:  Regular rate and rhythm with normal S1 and S2.  EXTREMITIES:  Demonstrated no cyanosis, clubbing or edema.  The pulses  were 2+ and symmetric.   Interrogation of his defibrillator demonstrates a Medtronic Maximo with  R waves of 17, a threshold of 2 at 0.2 and an impedance of 568 ohms.  There were no intercurrent ICD therapies. EKG demonstrates sinus rhythm  with normal axis and intervals.   IMPRESSION:  1. Ischemic cardiomyopathy.  2. Congestive heart failure.  3. Status post ICD insertion.  4. Atrial flutter status post catheter ablation.   DISCUSSION:  Overall Mr. Dy is stable. I have  asked that he decrease  his amiodarone to 200 mg daily and I have asked that he stop his  Coumadin all together. He will stay on aspirin. We will see him back in  3 months for our [facility] followup and I will see him back in 6 months.     [name]. [name], MD  Electronically Signed    GWT/MedQ  DD: 07/14/2006  DT: 07/14/2006  Job #: [id number]

## 2010-07-01 NOTE — Discharge Summary (Signed)
NAME:  [name], [name]                ACCOUNT NO.:  [id number]   MEDICAL RECORD NO.:  [id number]          PATIENT TYPE:  OIB   LOCATION:  6532                         FACILITY:  [facility]   PHYSICIAN:  Madolyn Frieze. Jens Som, MD, FACCDATE OF BIRTH:  Jul 04, 1950   DATE OF ADMISSION:  06/18/2006  DATE OF DISCHARGE:  06/19/2006                         DISCHARGE SUMMARY - REFERRING   PRIMARY CARDIOLOGIST:  Theron Arista C. Eden Emms, MD, [facility].   PRIMARY CARE PHYSICIAN:  Dr. Kevan Ny.   DISCHARGE DIAGNOSES:  1. Atrial flutter with resumption of amiodarone this admission, status      post electrophysiologic study with radiofrequency ablation of      atrial flutter by Dr. [name].  2. Subtherapeutic INR on admission.  3. Hyperglycemia.  4. History of diabetes.   PROCEDURES PERFORMED:  Radiofrequency ablation of atrial flutter and on  Jun 18, 2006 per Dr. Lewayne Bunting.   SUMMARY OF HISTORY:  Mr. Kuba is a 60 year old white male who was an  add-on in the office in regard to tachycardia.  He was recently seen by  his primary care physician secondary to sinusitis and bronchitis and was  noted to be tachycardia.  An EKG from his primary care physician as well  as in the office showed an atrial tachycardia consistent with atrial  flutter and a 2:1 block.  The patient denied any sensation of  palpitations.  Arrangements for followup with Dr. Graciela Husbands on June 14, 2006.  Dr. Graciela Husbands felt that this was atrial flutter and he did have some  associated symptoms in regards to fluid retention, mild exercise  intolerance.  Radiofrequency ablation for atrial flutter was discussed  and planned.   PAST MEDICAL HISTORY:  1. Ischemic cardiomyopathy with single chamber defibrillator.  2. Status post bypass surgery with perioperative atrial fibrillation.  3. Amiodarone and Coumadin was discontinued several weeks ago.  His      Coumadin was recently resumed by his primary care physician.  4. Type 2 diabetes.  5. EF 20-30%.   LABORATORY DATA:  H&H was 14.3 and 41.7, normal indices, platelets 307,  WBCs 14.9.  PTT 20.9, PT 13.4, and INR 1.2 on the 30th.  On May 3, PT  was 19.2 with INR 1.6.  Sodium was 139, potassium 4.1, BUN 24,  creatinine 1.1, glucose 335.  In the hospital on the 2nd and 3rd, he  received 10 mg of Coumadin.  EKG, postprocedure, showed normal sinus  rhythm with a ventricular rate of 81, first-degree AV block, nonspecific  interventricular conduction delay, nonspecific ST-T wave changes.   HOSPITAL COURSE:  Mr. Mineer underwent a TEE preliminary prior to  procedure.  EF was 20-25%.  Lateral wall was the only wall that moved  well.  Trivial AI.  He had a surgical absent left atrial appendage.  This was confirmed by a past note.  No evidence of PFO with moderate  plaquing in the aorta.  Dr. [name] performed EPS study with  radiofrequency ablation on Jun 18, 2006, for typical atrial flutter.  Procedure resulted in termination of atrial flutter and restoration of  normal sinus rhythm and  creation of a first-degree AV block.  Pharmacy  placed on heparin for heparin-Coumadin bridging.  On the 2nd, he  received 10 mg of Coumadin.  By the 3rd, INR was 1.6.  Dr. Jens Som,  after discussing with Dr. [name], felt that the patient could be home if  outpatient Lovenox could be arranged.  Prior to discharge pharmacy was  consulted and changed heparin to Lovenox 120 mg subcutaneous q.12 h.  He  received teaching for self-administration,thus he was discharged home.   DISPOSITION:  1. His activity was limited for approximately, in regards to lifting,      for 2 weeks, driving for 2 days.  2. He has been asked to maintain a low-sodium, heart-healthy, ADA      diet.  3. He will follow up with:      a.     Dr. [name] on Wednesday, Jul 14, 2006 at 11:30 a.m.      b.     Dr. Eden Emms as needed.      c.     Dr. Kevan Ny as needed.   He received new prescriptions for:  1. Coumadin 5 mg daily.  2. Lovenox 120 mg  subcutaneous q.12 h until INR therapeutic.  3. Amiodarone 200 mg, two tablets daily.   He was asked to continue his:  1. Lisinopril 10 mg daily.  2. Glyburide 5 mg b.i.d.  3. Metformin 500 mg b.i.d.  4. Furosemide 40 mg daily.  5. Zocor 40 mg daily.  6. Allopurinol 150 daily.  7. Klor-Con 20 daily.  8. Coreg 25 mg, half a tablet b.i.d.  9. Lantus 51 units as previously.  10.Aspirin 81 mg daily.   He will need a PT/INR on Monday and the office will call him with this  arrangement.  If his INR was therapeutic between 2-3, he can discontinue  the Lovenox.   DISCHARGE TIME:  Forty-five minutes.      Joellyn Rued, PA-C      Madolyn Frieze Jens Som, MD, [facility]  Electronically Signed   EW/MEDQ  D:  06/19/2006  T:  06/19/2006  Job:  [id number]   cc:   Noralyn Pick. Eden Emms, MD, [facility]  [place], Dr.

## 2010-07-01 NOTE — Op Note (Signed)
NAMEJULUS, [name]                ACCOUNT NO.:  [id number]   MEDICAL RECORD NO.:  [id number]          PATIENT TYPE:  OIB   LOCATION:  2899                         FACILITY:  [facility]   PHYSICIAN:  [name]. [name], MD    DATE OF BIRTH:  January 23, 1951   DATE OF PROCEDURE:  06/18/2006  DATE OF DISCHARGE:                               OPERATIVE REPORT   [name] is referred today for electrophysiologic study and catheter  ablation for atrial flutter.   I:  Introduction:  The patient is a 60 year old man with a history of an  ischemic cardiomyopathy and congestive heart failure, who had worsening  heart failure and was subsequently found to be in atrial flutter with a  rapid ventricular response.  He was started on Coumadin, and he is now  referred for electrophysiologic study and catheter ablation.  The  patient has undergone transesophageal echocardiogram demonstrating no  left atrial appendage thrombus.   II:  Procedure:  After informed consent was obtained, the patient was  taken to the diagnostic EP lab in the fasting state.  After procedure  preparation and draping, intravenous fentanyl and midazolam was given  for sedation.  A 6-French hexapolar catheter was inserted percutaneously  in the right jugular vein and advanced to the coronary sinus.  A 7  French 20 Pole Halo catheter was inserted percutaneously in the right  femoral vein and advanced to the right atrium.  A 5-French quadripolar  catheter was inserted percutaneously in the right femoral vein and  advanced to the right Atrium and advanced to his fundal region.  After  measurement of the basic intervals, mapping was carried out  demonstrating typical counter-clockwise tricuspid annular reentrant  atrial flutter at a cycle length of 270 msec.  Mapping demonstrated  early activation of the coronary sinus os, late in the lateral wall of  the coronary sinus, and a counter-clockwise activation around the  tricuspid valve  annulus.  With this information, the 7 French  quadripolar ablation catheter was manipulated into the right atrium and  mapping again carried out.  A total of 9 RF energy applications were  then delivered to the usual atrial flutter isthmus which resulted in  termination of atrial flutter during the third RF energy application and  restoration of sinus rhythm.  Isthmus block was persistent.  Four  additional RF energy applications were then delivered with permanent  atrial flutter isthmus block demonstrated.  Following this, the patient  had 2 bonus RF energy applications and was observed for 45 minutes.  During this time, there was no atrial flutter isthmus conduction.  Also,  during this time, rapid ventricular pacing was carried out from the RV  apex which demonstrated VA dissociation at 600 msec.  Programmed  ventricular stimulation was carried out from the RV apex, also  demonstrating VA dissociation at 600 msec.  Progressed atrial  stimulation was carried out from the coronary sinus and the right atrium  with base drive cycle length was 981 msec and stepwise decreased down to  410 msec where AV Wenckebach was observed.  During rapid  atrial pacing,  the PR interval was equal to but not greater than the RR interval, and  there was no inducible SVT.  Programmed atrial stimulation was carried  out from the coronary sinus in the right atrium with base drive cycle  length of 045 msec.  The S1-S2 interval was stepwise decreased down to  320 msec where A-V nodal __________ was observed.  During programmed  atrial stimulation, there was a jump in echo beat noted but no inducible  SVT.   III:  G:  Rhythm observed.  Atrial flutter.  Initiation was present at  time of EP study.  Direction was sustained.  Method of termination was  with catheter ablation.  Cycle length 271 msec.   IV:  H:  Mapping of the atrial flutter isthmus demonstrated a very large  atrial flutter isthmus with very large  atrial flutter electrogram  amplitude.  RF energy application.  A total of 9 RF energy applications  were delivered including 3 bonus RF energy applications to the usual  atrial flutter isthmus resulting in termination of atrial flutter,  restoration of sinus rhythm, and creation of bidirectional block in the  atrial flutter isthmus.   V:  Conclusion:  Study demonstrates successful electrophysiology study  and RF catheter ablation of typical atrial flutter with a total of 9 RF  energy applications to the usual entry flutter isthmus.      [name]. [name], MD  Electronically Signed     GWT/MEDQ  D:  06/18/2006  T:  06/18/2006  Job:  [id number]   cc:   Noralyn Pick. Eden Emms, MD, [facility]  Candyce Churn, M.D.

## 2010-07-01 NOTE — Assessment & Plan Note (Signed)
[facility]                            CARDIOLOGY OFFICE NOTE   Kyle, Fernandez                       MRN:          [id number]  DATE:07/20/2007                            DOB:          1950-12-24    [name] returns today for followup.   The patient has a history of atrial tachycardia and Afib, on chronic  Coumadin and low-dose amiodarone.  He has had a previous TE  cardioversion.   The patient has ischemic cardiomyopathy, status post CABG with an EF in  the 25% range.   He has been doing fairly well.  Unfortunately, he continues to be quite  heavy at 272 pounds.  His weight is actually up, close to 10 pounds  since July 2008.  I had a lengthy discussion with Kellis about this.  He  claims not to be eating a lot.  I went over a [place] with  him.  In particular, trying to get him to avoid eating Nabs in the  morning.   From a cardiac standpoint, he is not having any significant chest pain.  He has had mild lower extremity edema.  He has had infrequent  palpitations, and no evidence of prolonged recurrences of his atrial  tachycardia.   He does work in his garden when he gets home from work, but is otherwise  fairly sedentary.   He needs to have a followup amiodarone blood work in 6 months.   I suspect his sugars still are not under very good control.  I do not  have a recent hemoglobin A1c on him.   REVIEW OF SYSTEMS:  Remarkable for what sounds like a migratory  arthritis.  He had been on Voltaren for a while but this shot his  Coumadin up.  Apparently, Dr. Kevan Ny has done some lab work, and he does  not have arthritis.  I told him it would be unusual for his Zocor to be  causing this particularly since he has focal tenderness over the left  hand at the mid MP joint.  I told him we would get x-rays of his hands  and right knee, and I asked him to let Dr. Kevan Ny know because I think he  should probably see a rheumatologist.   His review of systems are otherwise negative.  In particular, he has not  had any significant bleeding, diaphysis, prolonged palpitations, or  chest pain.   CURRENT MEDICATIONS:  1. Coumadin as directed, to see the clinic today.  2. Carvedilol 50 mg in the morning and 25 mg at night.  3. Baby aspirin a day.  4. Glyburide 10 mg b.i.d.  5. Lasix 40 mg a day.  6. Klor-Con 20 mEq a day.  7. Lantus as directed.  8. Lisinopril 10 mg a day.  9. Cordarone 200 mg a day.  10.Simvastatin 20 a day, to be held for at least a month.   PHYSICAL EXAMINATION:  Remarkable for an overweight white male in no  distress.  Weight up 10 pounds at 272, blood pressure 124/70, pulse 69 with  occasional PACs, respiratory  rate 14, and afebrile.  HEENT:  Unremarkable.  NECK:  Carotids are normal without bruit.  No lymphadenopathy,  thyromegaly, or JVP elevation.  LUNGS:  Clear without diaphragmatic motion.  No wheezing.  S1 and S2.  Normal heart sounds.  PMI, not palpable.  ABDOMEN:  Protuberant.  Bowel sounds positive.  No AAA.  No bruit.  No  tenderness.  No hepatosplenomegaly.  No hepatojugular reflux.  Distal pulses are intact with trace edema.  NEUROLOGICAL:  Nonfocal.  SKIN:  Warm and dry.  No muscular weakness..  He has focal tenderness over the left middle MP  joint.   IMPRESSION:  1. Ischemic cardiomyopathy, no chest pain.  Continue aspirin and beta      blocker.  2. Congestive heart failure, ejection fraction 25%.  Reassess LV      function in about 6 months.  Continue current dose of diuretics and      afterload reduction.  BMET and BNP in 6 months.  3. History of atrial tachycardia and atrial fibrillation.  Continue      Coumadin.  INR to be checked today.  Careful with nonsteroidals.  4. Hyperlipidemia in the setting of coronary disease and coronary      artery bypass graft.  Zocor to be held for a month to see if it      helps with his migratory arthritis.  5. Migratory arthritis.  Follow  up with Dr. Kevan Ny, strongly consider      Rheumatology referral.  We will check x-rays of his hands and right      knee to see if there are arthritic changes.  We will try to get the      lab work from Dr. Isac Sarna office to see what his rheumatoid factor,      sed rate, and ANA were.  6. Amiodarone surveillance.  Lipid, thyroid, and DLCO in 6 months.  7. Diabetes, poorly controlled.  Again, [place] with low      carbs would help.  Hemoglobin A1c per Dr. Kevan Ny.  This is a      significant long-term risk factor for the patient.   Continue current dose of glyburide, Lantus, and Glucophage.     Noralyn Pick. Eden Emms, MD, [facility]  Electronically Signed    PCN/MedQ  DD: 07/20/2007  DT: 07/21/2007  Job #: [id number]

## 2010-07-01 NOTE — Assessment & Plan Note (Signed)
[facility]                            CARDIOLOGY OFFICE NOTE   Kyle, Fernandez                       MRN:          [id number]  DATE:01/03/2008                            DOB:          Feb 10, 1951    [name] returns today for followup.  He has a history of ischemic  cardiomyopathy with an EF of 25%.  He has an AICD in place.  This is  followed by Dr. [name].  He has not had any discharges.  Coronary risk  factors include hypercholesterolemia and diabetes.   He is on chronic Pacerone.  The patient is on chronic Pacerone and needs  followup LFTs, thyroid, and PFTs   He has been working on a regular basis.  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   He is not having any significant chest pain, PND, or orthopnea.   Unfortunately, he continues to smoke and talk to him about this and will  give him a prescription for Wellbutrin.  He has quit in the past using  Chantix, but this cost him 200 dollars and we will try to give him  something little less expensive.   His review of systems remarkable for some congestion.  He has been  taking some Mucinex and other decongestants, it sounds like he has a  URI.   He has had a TEE-guided cardioversion in the past and is on chronic  Coumadin.   MEDICATIONS:  1. Coumadin as directed.  2. Baby aspirin.  3. Lasix 40 a day.  4. Carvedilol 12.5 in the morning and 25 at night.  5. Glyburide 5 b.i.d.  6. Metformin 1 g b.i.d.  7. Potassium 10 b.i.d.  8. Lantus sliding scale b.i.d.  9. Lisinopril 10 nightly.  10.Crestor 20 a day.  11.Pacerone 200 a day.   PHYSICAL EXAMINATION:  GENERAL:  Remarkable for an overweight white male  in no distress.  VITAL SIGNS:  His weight is 266, blood pressure 113/73, pulse 87 and  regular, respiratory rate 14, afebrile.  HEENT:  Unremarkable.  Carotids normal without bruit, no  lymphadenopathy, no thyromegaly, JVP elevation.  LUNGS:  Clear.  Good diaphragmatic motion.  No wheezing.  S1  and S2 with  distant heart sounds status post sternotomy.  ABDOMEN:  Benign.  Bowel sounds positive.  No AAA, no tenderness, no  bruit, no hepatosplenomegaly, hepatojugular reflux, tenderness.  EXTREMITIES:  Distal pulses intact.  No edema.  NEURO:  Nonfocal.  SKIN:  Warm and dry.  MUSCULOSKELETAL:  No muscular weakness.   IMPRESSION:  1. Coronary artery disease, previous CABG, is not having chest pain.      Continue aspirin and beta-blocker.  2. History of congestive heart failure, currently functional class I.      Continue current dose of diuretic, appears euvolemic.   DATA:  1. Status post biventricular automatic implantable cardioverter-      defibrillator.  Followup in pacer clinic.  No discharges.  2. Diabetes.  Followup primary care MD.  Hemoglobin A1c quarterly.  3. Amiodarone therapy, with cardioversion for PAF and I believe a  flutter ablation.  Followup LFTs, thyroid, and PFTs.   I will see him back in 6 months.     Noralyn Pick. Eden Emms, MD, [facility]  Electronically Signed    PCN/MedQ  DD: 01/03/2008  DT: 01/03/2008  Job #: [id number]

## 2010-07-04 NOTE — Assessment & Plan Note (Signed)
[facility]                            CARDIOLOGY OFFICE NOTE   [name], [name]                       MRN:          [id number]  DATE:04/30/2006                            DOB:          [birth year]    [name] returns today for followup.  He has an ischemic cardiomyopathy.  He is status post recent CABG.  His postop course was complicated by PAF  and volume overload.  He was initially followed by Dr. Juliann Pares, in  [place].  He subsequently transferred his care to [place] after his  bypass, courtesy of Dr. Tressie Stalker who did his surgery.  He says he  has been doing fairly well.  We initially had stopped his Zocor due to  some myalgias, but they were not any better.  He has run out of his  Coreg for the last 2 months and has not taken it.  Apparently there were  some issues with [place] not refilling it until he came in to see [place].  He has  not had any significant PND or orthopnea, he has not had any significant  palpitations.  I told him since there was no evidence of recurrent afib  I would stop his Cordarone; however, I would like him back on his Coreg  for at least 2-3 weeks before we do this.  As expected, his heart rate  was a little high today off the Coreg.   MEDICATIONS:  1. An aspirin a day.  2. Coreg 12.5 b.i.d.  3. Simvastatin 20 a day.  4. Glyburide 10 b.i.d.  5. Lasix 40 a day.  6. Klor-Con 20 a day.  7. Lantus as directed.  8. Lisinopril 10 a day.  9. Glucophage 500 b.i.d.  10.Cordarone 200 a day which will be stopped.   EXAM:  Remarkable for a blood pressure of 130/70, pulse is 89 and  regular.  HEENT:  Normal.  CAROTIDS:  Normal without bruit.  LUNGS:  Clear.  He has an AICD under the left clavicle.  There is an S1,  S2.  Distant heart sounds.  ABDOMEN:  Benign.  LOWER EXTREMITIES:  Intact pulses, no edema.   IMPRESSION:  1. Ischemic cardiomyopathy, ejection fraction is in the 20-30% range.  2. Status post coronary artery  bypass surgery with limited functional      recovery.   He will continue his current dosages of lisinopril and diuretics.  He  will get back on his Coreg at 12.5 b.i.d.  Hopefully his heart rate will  come down.  He appears euvolemic.   He had previously been on Actos, which has now been stopped, and he is  on glyburide and Glucophage, which are fine, in regards to his volume  status.  He has had a few episodes of afib primarily periop.  We will  stop his Cordarone once his beta-blocker is reinstituted.  He will  continue an aspirin a day.   His primary care M.D. will follow up his hemoglobin A1c's.   He will start back on his simvastatin, since it made no difference in  his lower extremity  edema or myalgias.  He will have a follow up lipid  and liver panel in about 6 months.   Overall, given how sick [name] was at the time of his surgery, I think he  is doing fairly well.  We will reassess his LV function probably in 6  months to a year.  He may be a good candidate for cardiac MRI.  He  cannot have a cardiac MRI due to his AICD and I suspect the best way to  re-evaluate his LV function will be with MUGA scan or echo.  His EKG  today showed sinus rhythm with first degree heart block and previous  inferior and anterior wall MI.     Noralyn Pick. Eden Emms, MD, [facility]  Electronically Signed    PCN/MedQ  DD: 04/30/2006  DT: 05/02/2006  Job #: [id number]

## 2010-07-04 NOTE — Assessment & Plan Note (Signed)
[facility]                           ELECTROPHYSIOLOGY OFFICE NOTE   [name], [name]                       MRN:          [id number]  DATE:09/04/2005                            DOB:          1950-05-08    HISTORY:  Mr. Puff is referred today by Dr. Charlton Haws for evaluation  and consideration for ICD implantation.   HISTORY OF PRESENT ILLNESS:  The patient is a very pleasant 60 year old man  with an ischemic cardiomyopathy and severe LV dysfunction.  He had a MI back  in April, he underwent bypass surgery at that time with a mitral valve  annuloplasty at that time as well.  The patient did develop postoperative  atrial fibrillation and has been placed on Amiodarone for this.  He returns  today for followup.  He notes that he has maintained sinus rhythm very  nicely after repeat cardioversion back in June.  He denies chest pain.  He  does have dyspnea with exertion which is fairly mild (class II).  The  patient has had no syncope.  He denies any palpitations since his  cardioversion.  He has never had syncope.   PAST MEDICAL HISTORY:  1.  Diabetes.  2.  History of hypertension.  3.  Dyslipidemia.  4.  History of atrial fibrillation postoperatively.   FAMILY HISTORY:  Noncontributory.   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  He denies tobacco abuse, he denies alcohol abuse.   REVIEW OF SYSTEMS:  Notable for some arthritic complaints.  He has mild  dyspnea on exertion.  He denies chest pain.  He denies nausea, vomiting,  diarrhea, or constipation, polyuria, or polydipsia.  Please note, the rest  of his review of systems was negative except as noted in the HPI.   PHYSICAL EXAMINATION:  GENERAL:  He is a pleasant middle-aged man in no  acute distress.  VITAL SIGNS:  The blood pressure today was 120/80, pulse 79 and regular,  respirations are 18.  The weight was 259 pounds.  HEENT:   Normocephalic, atraumatic.  Pupils equal and round.  The oropharynx  is moist.  Sclerae are anicteric.  NECK:  No jugular venous distention.  There is no thyromegaly.  Trachea is  midline.  The carotids are 2+ and symmetric.  LUNGS:  Clear to auscultation bilaterally.  There are no wheezes, rhonchi,  or rales present.  No increased work of breathing.  CARDIOVASCULAR:  Regular rate and rhythm with normal S1 and S2.  There are  no murmurs, rubs, or gallops appreciated.  The PMI was enlarged and  laterally displaced.  ABDOMEN:  Obese, nontender, nondistended.  There was no organomegaly.  Bowel  sounds present.  There is no rebound or guarding.  EXTREMITIES:  No cyanosis, clubbing, or edema.  Pulses were 2+ and  symmetric.  NEUROLOGIC:  Alert and oriented x3.  Cranial nerves intact.  Strength was  5/5 and symmetric.   LABORATORY DATA:  EKG demonstrates sinus rhythm with QRS duration of 100  milliseconds.   IMPRESSION:  1.  Ischemic cardiomyopathy with persistent left ventricular dysfunction,      ejection fraction of 20% by echocardiogram three months after bypass.  2.  Congestive heart failure, presently class II.  3.  Hypertension.  4.  Dyslipidemia.   DISCUSSION:  Overall, Mr. Schoffstall is stable, but is a candidate for  prophylactic ICD implantation.  Because his heart failure is not class III,  but class II at most and because his QRS duration is narrow, I would not  recommend bi-V ICD implantation.  I will plan to proceed with device implant  in the next several weeks.                                   [name]. [name], MD   GWT/MedQ  DD:  09/04/2005  DT:  09/04/2005  Job #:  [id number]   cc:   Noralyn Pick. Eden Emms, MD, [facility]  Candyce Churn, MD
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
## 2010-07-04 NOTE — Assessment & Plan Note (Signed)
[facility]                           ELECTROPHYSIOLOGY OFFICE NOTE   [name], [name]                       MRN:          [id number]  DATE:01/05/2006                            DOB:          [birth year]    INDICATIONS FOR PROCEDURE:  [name] returns today for followup.  He is a  very pleasant middle-aged man with an ischemic cardiomyopathy, coronary  artery disease, status post bypass surgery and a history of atrial  fibrillation who returns today for followup.  He has maintained sinus rhythm  since his ICD was placed and overall feels well.  He denies chest pain or  shortness of breath. He denies peripheral edema.   PHYSICAL EXAMINATION:  GENERAL:  He is a pleasant, well-appearing obese man,  in no acute distress.  VITAL SIGNS:  Blood pressure today 130/84, pulse 74 and regular,  respirations 18.  Weight was not recorded.  NECK:  No jugular venous distention.  LUNGS:  Clear bilaterally to auscultation.  CARDIOVASCULAR:  A regular rate and rhythm with normal S1 and S2.  EXTREMITIES:  Demonstrated no edema.   Interrogation of his defibrillator demonstrates a Medtronic Maxima with R-  waves of 18, a pacing impedance of 544 ohm's at threshold __________ of 0.5  msec.  The battery voltage at 3.2 volts.   IMPRESSION:  1. Ischemic cardiomyopathy.  2. Congestive heart failure.  3. Atrial fibrillation.  4. Chronic amiodarone therapy.   DISCUSSION:  Overall, [name] is stable.  His defibrillator is working  normally.  Will plan to see him back in one year.     [name]. [name], MD  Electronically Signed    GWT/MedQ  DD: 01/05/2006  DT: 01/05/2006  Job #: [id number]

## 2010-07-04 NOTE — Assessment & Plan Note (Signed)
[facility]                            CARDIOLOGY OFFICE NOTE   YU, PEGGS                       MRN:          [id number]  DATE:01/14/2006                            DOB:          1950/10/28    [name] returns today for followup.  He is status post bypass for ischemic  cardiomyopathy.  He initially had been followed in [place].  Dr. Cornelius Moras  asked if I would follow him after his bypass surgery.  Unfortunately,  [name] already has an EF that is only in the 20% range.  His volume  status has been stable.  He has not been compliant with his Lasix  dosing.   He has been maintaining sinus rhythm on amiodarone.  He does have an  AICD in place and saw Dr. [name] on January 05, 2006.   His sugar is being reasonably controlled.  He does have some myalgias in  his elbows and legs, and I told him to stop his Zocor for about a month  to see if this improves it.   EXAM:  He is in sinus rhythm with a rate of 80.  Blood pressure is  119/72.  HEENT:  Normal.  There are no JVP.  There is no thyromegaly.  There is no  lymphadenopathy.  LUNGS:  Clear.  There is an S1, S2 with normal heart sounds.  ABDOMEN:  Benign.  Distal pulses are intact.  He has a neuropathy, but no edema currently.   IMPRESSION:  Stable ischemic cardiomyopathy.  Continue Coreg dose of  12.5 b.i.d.  Stop Zocor to see if his myalgias improve.  Continues to  followup with his general medical doctor for control of his sugar.  His  Actos was stopped in the hospital due to his propensity for heart  failure.  Will continue his Lasix at 40 a day.  We have decreased his  Cordarone at 200 a day and we will keep it at this for the time-being.  I will see him back in 3 months.     Noralyn Pick. Eden Emms, MD, [facility]  Electronically Signed    PCN/MedQ  DD: 01/14/2006  DT: 01/14/2006  Job #: [id number]

## 2010-07-04 NOTE — Op Note (Signed)
[name], [name]                ACCOUNT NO.:  [id number]   MEDICAL RECORD NO.:  [id number]          PATIENT TYPE:  INP   LOCATION:  2030                         FACILITY:  [facility]   PHYSICIAN:  Olga Millers, M.D. LHCDATE OF BIRTH:  [birth year]   DATE OF PROCEDURE:  05/24/2005  DATE OF DISCHARGE:                                 OPERATIVE REPORT   This is cardioversion of atrial fibrillation.  This patient was sedated with  Amidate 12 mg intravenously.  Synchronized cardioversion with 120 joules  (biphasic).  Results to a normal sinus rhythm.  There were no immediate  complications.  We recommend continuing with the Coumadin.           ______________________________  Olga Millers, M.D. [facility]     BC/MEDQ  D:  06/24/2005  T:  06/24/2005  Job:  [id number]

## 2010-07-04 NOTE — Op Note (Signed)
NAMENAVRAJ, DREIBELBIS                ACCOUNT NO.:  [id number]   MEDICAL RECORD NO.:  [id number]          PATIENT TYPE:  INP   LOCATION:  2301                         FACILITY:  [facility]   PHYSICIAN:  Salvatore Decent. Cornelius Moras, M.D. DATE OF BIRTH:  Jan 24, 1951   DATE OF PROCEDURE:  06/11/2005  DATE OF DISCHARGE:                                 OPERATIVE REPORT   PREOPERATIVE DIAGNOSIS:  Severe three-vessel coronary artery disease status  post acute non-Q-wave myocardial infarction with class IV postinfarction  angina and ischemic cardiomyopathy with class IV congestive heart failure.   POSTOPERATIVE DIAGNOSIS:  Severe three-vessel coronary artery disease status  post acute non-Q-wave myocardial infarction with class IV postinfarction  angina and ischemic cardiomyopathy with class IV congestive heart failure  with moderate ischemic mitral regurgitation.   PROCEDURE:  Median sternotomy for coronary artery bypass grafting x4 (left  internal mammary artery to distal left anterior descending coronary artery,  saphenous vein graft to second diagonal branch, saphenous vein graft to  circumflex marginal branch, saphenous vein graft to left posterolateral  branch, endoscopic saphenous vein harvest from right thigh and right lower  leg) and mitral valve annuloplasty (26-mm Edwards IMR ETlogix annuloplasty  ring) and placement of right femoral arterial line.   SURGEON:  Dr. Purcell Nails   ASSISTANT:  Miss Zadie Rhine   ANESTHESIA:  General.   BRIEF CLINICAL NOTE:  The patient is a 60 year old obese male with  hypertension, type 2 diabetes mellitus, hyperlipidemia and longstanding  tobacco abuse.  The patient presents with acute onset chest pain and  shortness of breath and was initially evaluated at [facility].  He ruled in for an acute non-Q-wave myocardial infarction.  He  underwent cardiac catheterization by Dr. Perlie Gold.  He was found to  have severe three-vessel  coronary artery disease with left dominant coronary  circulation and severe left ventricular dysfunction with ischemic  cardiomyopathy.  The patient was transferred to [facility]  for further management.  Follow-up echocardiogram confirms the presence of a  dilated left ventricle with severe left ventricular dysfunction and overall  ejection fraction estimated 15-20%.  There is moderate mitral regurgitation.   OPERATIVE CONSENT:  The patient has been counseled at length regarding the  indications, risks, and potential benefits of coronary artery bypass  grafting.  The patient understands the high risk nature of surgical  intervention.  Alternative treatment strategies have been discussed.  He  understands and accepts all associated risks of surgery including but not  limited to risk of death, stroke, myocardial infarction, congestive heart  failure, acute ventricular failure requiring mechanical ventricular support,  pneumonia, bleeding requiring blood transfusion, arrhythmia, infection, and  recurrent coronary artery disease.  All of his questions have been  addressed.   OPERATIVE FINDINGS:  1.  Ischemic cardiomyopathy with severe left ventricular dysfunction, EF<20%  2.  Moderate ischemic mitral regurgitation (3+).  3.  Good-quality left internal mammary artery and saphenous vein conduit for      grafting.  4.  Diffuse coronary artery disease with fair targets for grafting.  5.  No residual mitral regurgitation after successful mitral valve repair      and slightly improved left ventricular function after discontinuing      cardiopulmonary bypass.   OPERATIVE NOTE IN DETAIL:  The patient was brought to the operating room and  above-mentioned date.  On the morning of surgery, the patient had ongoing  chest pain requiring escalating doses of IV nitroglycerin.  Despite  escalating doses of nitroglycerin, the patient continued to report chest  pain up until the time he  was brought into the operating room.  A 12-lead  electrocardiogram does not reveal acute ST-segment changes.  Central venous  monitoring was established by the anesthesia service under the care and  direction of Dr. Judie Petit.  Specifically, a Swan-Ganz catheter was  placed through the right internal jugular approach.  A radial arterial line  is placed.  Intravenous antibiotics are administered.  General endotracheal  anesthesia is induced uneventfully.  A Foley catheter is placed.  The  patient's chest, abdomen, both groins, and both lower extremities are  prepared and draped in a sterile manner.   Baseline transesophageal echocardiogram is performed by Dr. Randa Evens.  This  demonstrates severe left ventricular dysfunction with dilated left  ventricle.  There is severe hypokinesis of the entire left ventricle with  akinesis of the anterior wall and interventricular septum.  There is  moderate (3+) mitral regurgitation.  The mitral apparatus appears normal but  there is dilatation of the mitral annulus as well as restriction of the  posterior leaflet of mitral valve during systole with a broad central jet of  mitral regurgitation oriented in three different directions and filling the  majority of the left atrium.  The aortic valve appears normal.  No other  significant abnormalities were noted.   A right femoral arterial line is placed using the Seldinger technique for  monitoring purposes.   A median sternotomy incision was performed and left internal mammary artery  is dissected from the chest wall and prepared for bypass grafting.  The left  internal mammary artery is a good-quality conduit.  Simultaneously  ,saphenous vein is obtained from the patient's right thigh from the upper  portion of the right lower leg using endoscopic vein harvest technique.  After the saphenous vein is removed, the small surgical incisions in the right lower extremity are closed in multiple layers  with running absorbable  suture.  The saphenous vein is good-quality conduit.  The patient is  heparinized systemically.  The pericardium is opened.  The ascending aorta  is normal in appearance.   The ascending aorta is cannulated for cardiopulmonary bypass.  A venous  cannula is placed directly in the superior vena cava.  A retrograde  cardioplegic catheter is placed through the right atrium into the coronary  sinus.  A second venous cannula is placed low in the right atrium with tip  extending down the inferior vena cava.  Cardiopulmonary bypass is begun and  the surface of the heart is inspected.  There is evidence for ischemic  cardiomyopathy with some diffuse scarring throughout.  There is diffuse  coronary artery disease with left dominant coronary circulation.  Distal  targets are selected for coronary bypass grafting.  Portions of saphenous  vein and the left internal mammary artery are trimmed to appropriate  lengths.  A temperature probe is placed left ventricular septum.  A  cardioplegic catheter is placed in the ascending aorta.  Vessel loops ae  placed around the superior  vena cava and the inferior vena cava.   The patient is cooled to 28 degrees systemic temperature.  The aortic  crossclamp is applied and cold blood cardioplegia is administered in  antegrade fashion through the aortic root.  Iced saline slush is applied for  topical hypothermia.  Supplemental cardioplegia is administered retrograde  through the coronary sinus catheter.  The initial cardioplegic arrest and  myocardial cooling are felt to be excellent.  Repeat doses of cardioplegia  are administered intermittently throughout the crossclamp portion of the  operation antegrade through the aortic root, antegrade down the subsequently  placed vein grafts, and retrograde through the coronary sinus catheter to  maintain left ventricular septal temperature below 15 degrees centigrade.   The following distal  coronary anastomoses are performed:  1.  The posterolateral branch of the distal left circumflex coronary artery      is grafted with a saphenous vein graft in an end-to-side fashion.  This      vessel measures 1.5 mm in diameter.  It is diffusely diseased but a fair      to good quality target at the site of grafting.  There are calcified      plaques in both directions in this vessel.  Of note, the posterior      descending coronary artery is too diffusely diseased and filled with      hard calcified plaque and not a suitable target for grafting.  2.  The circumflex marginal branch is grafted with the saphenous vein graft      in end-to-side fashion.  This vessel measures 1.5 mm in diameter and is      fair to good quality target.  3.  The second diagonal branch off the left anterior descending coronary      artery is grafted with the saphenous vein graft in end-to-side fashion.     This vessel measures 1.3 mm in diameter and is a fair to good quality      target at the site of distal grafting.  4.  The distal left anterior descending coronary artery is grafted with a      left internal mammary artery in end-to-side fashion.  This vessel is      diffusely diseased with calcified plaque throughout.  At the site of      distal bypass, it measures 1.7 mm in diameter.  It is a fair quality      target.   A left atriotomy incision is performed posteriorly through the interatrial  groove.  The mitral valve is exposed using a self-retaining retractor.  Exposure is felt to be satisfactory.  The mitral valve is inspected.  The  mitral valve apparatus appears normal.  There is dilatation of the mitral  annulus.  Ring annuloplasty is performed using interrupted 2-0 Ethibond  horizontal mattress sutures placed circumferentially around the mitral  annulus.  The mitral valve is sized to accept between a 26 and 28 mm ring.  Mitral annuloplasty is performed using a Edwards Life sign INR ETlogix   McCarthy Adams annuloplasty ring (model number 4100, serial number [id number]).  After the annuloplasty is completed, the mitral valve is tested for  competence and appears to be perfectly competent.  Rewarming is begun.   The left atrial appendage is oversewn from within the left atrium using a  two-layer closure of running 3-0 Prolene suture.  The left atriotomy is now  closed using a two-layer closure of running 3-0 Prolene suture.  All three  proximal saphenous vein anastomoses are performed directly to the ascending  aorta prior to removal of the aortic crossclamp.  The left ventricular  septal temperature rises rapidly with reperfusion of the left internal  mammary artery.  One final dose of warm retrograde hot shot cardioplegia is  administered.  The aortic crossclamp is removed after a total crossclamp  time of 143 minutes.   The patient is rewarmed to 37 degrees Centigrade core temperature and loaded  with milrinone during the rewarming phase.  The patient is weaned from  cardiopulmonary bypass on low dose milrinone, dopamine, and very low-dose  epinephrine.  The patient's rhythm at separation from bypass is junctional  rhythm.  AV sequential pacing is employed.  Normal sinus rhythm resumes  spontaneously following separation from bypass.  Total cardiopulmonary  bypass time for the operation is 179 minutes.   Follow-up transesophageal echocardiogram performed by Dr. Randa Evens after  separation from bypass demonstrates slight improvement in left ventricular  function.  There is a well-seated mitral valve annuloplasty ring.  There is  no residual mitral regurgitation.  There is no significant residual air.   The venous and arterial cannulae are removed uneventfully.  Protamine is  administered to reverse the anticoagulation.  The mediastinum and the left  chest are irrigated with saline solution containing vancomycin.  Meticulous surgical hemostasis is ascertained.  The mediastinum and  left chest are  drained using three chest tubes exited through separate stab incisions  inferiorly.  The soft tissues anterior to the aorta are reapproximated  loosely.  The sternum is closed with double-strength sternal wire.  The soft  tissues anterior to the sternum are closed in multiple layers with running  absorbable suture.  The skin is closed with a subcuticular skin closure.   The patient tolerated the procedure well and is transported to the surgical  intensive care unit in stable condition.  There are no intraoperative  complications.  All sponge, instrument and needle counts are verified  correct at completion of the operation.  No blood products were  administered.      Salvatore Decent. Cornelius Moras, M.D.  Electronically Signed     CHO/MEDQ  D:  06/11/2005  T:  06/12/2005  Job:  [id number]   cc:   Perlie Gold, MD   Candyce Churn, M.D.  Fax: [phone]

## 2010-07-04 NOTE — Assessment & Plan Note (Signed)
[facility]                           ELECTROPHYSIOLOGY OFFICE NOTE   Kyle, Fernandez                       MRN:          [id number]  DATE:10/12/2005                            DOB:          1950-03-09    HISTORY OF PRESENT ILLNESS:  [name] is seen in device clinic today  October 12, 2005, for routine follow up of his newly implanted Medtronic  Maxima [id number] single chamber defibrillator procedure done August 8 for  ischemic cardiomyopathy.   Upon examination, site looks good without redness or swelling.  Steri-Strips  had been previously removed, and site is healing quite nicely.  Upon  interrogation, battery voltage is 3.21 volts with last recorded charge time  of 8.37 seconds.  There were no episodes stored in the device in the right  ventricle.  Intrinsic amplitude is 14 millivolts with an impedence of 464  ohms and threshold of one volt with 0.4 milliseconds.  High voltage lead  impedence is 48 ohms.  Discriminators were turned on at this visit to  include turning wavelet on, onset was turned on, and stability was turned on  at 30 milliseconds.  He will be seen for follow up in November by Dr. [name]  and will begin pure link transmissions at that time.                                   Cleatrice Burke, RN                                [name]. [name], MD   CF/MedQ  DD:  10/12/2005  DT:  10/12/2005  Job #:  [id number]

## 2010-07-04 NOTE — Assessment & Plan Note (Signed)
[facility]                              CARDIOLOGY OFFICE NOTE   RASHEED, WELTY                       MRN:          [id number]  DATE:10/01/2005                            DOB:          Jun 17, 1950    Mr. Dennard returns today for follow up.  He has ischemic cardiomyopathy.  He  did get his defibrillator placed by Dr. [name], it was not a BiV device, it  was a single lead.   He has been doing fairly well.  He has been working since June at the  [employer].  He has been maintaining sinus rhythm.  I told him we  would stop his Coumadin and cut his amiodarone back to 400 once a day.   Unfortunately, the patient continues to smoke.  We had a long discussion  about this.  He has a prescription for Chantix and I encouraged him to try  to stop and start the medication.   EXAM:  Blood pressure is 120/70, pulse is 70 and regular, lungs are clear,  carotids normal.  The AICD site is well healed.  He still has his Steri-  Strips in place.  Abdomen is benign.  Lower extremities intact pulse, no  edema.   IMPRESSION:  1. Ischemic cardiomyopathy, ejection fraction 20% status post coronary      artery bypass graft.  2. Postoperative paroxysmal atrial fibrillation, apparently controlled now      with amiodarone dose decreased and Coumadin stopped.   Follow up AICD with Dr. [name].  I will see him back in about 3 months.                               Noralyn Pick. Eden Emms, MD, [facility]    PCN/MedQ  DD:  10/01/2005  DT:  10/01/2005  Job #:  [id number]

## 2010-07-04 NOTE — Op Note (Signed)
[name], [name]                ACCOUNT NO.:  [id number]   MEDICAL RECORD NO.:  [id number]          PATIENT TYPE:  INP   LOCATION:  4735                         FACILITY:  [facility]   PHYSICIAN:  [name]. [name], M.D.  DATE OF BIRTH:  Aug 16, 1950   DATE OF PROCEDURE:  09/21/2005  DATE OF DISCHARGE:                                 OPERATIVE REPORT   PROCEDURE PERFORMED:  Implantation of a single chamber defibrillator.   INDICATION:  Ischemic cardiomyopathy with class II heart failure status post  MI with EF of 20%.   INTRODUCTION:  The patient is a very pleasant middle-aged male with a  history of ischemic cardiomyopathy status post MI with a history of atrial  fibrillation on amiodarone.  The patient has been maintained in sinus rhythm  on amiodarone.  His EF is 20-25% and has been so since his bypass surgery.  He has class II heart failure symptoms with a narrow QRS.  He is now  referred for prophylactic ICD implantation secondary to his ischemic  cardiomyopathy with an EF of 20% and class II heart failure.   PROCEDURE:  After informed consent was obtained, the patient was taken to  the diagnostic EP lab in a fasting state.  After the usual preparation and  draping, intravenous fentanyl with __________was given for sedation.  Lidocaine, 30 mL, was infiltrated into the left infraclavicular region.  An  8 cm incision was carried out over this region, electrocautery utilized to  dissect down to the fascial plane.  The left subclavian vein was punctured  after 10 mL of contrast was injected into it, demonstrating it to be patent.  The Medtronic model [phone] 65-cm active fixation defibrillation lead, serial  number [id number], was advanced into the right ventricle.  Mapping was  carried out and at the final site, the R-waves measured 10 mV, and the  pacing impedance was 559 ohms.  There was a nice entry current demonstrated,  and the pacing threshold was 0.8 volts at 0.5 msec.  Ten volt  pacing did not  stimulate the diaphragm.  With these satisfactory parameters, the lead was  secured to the subpectoralis fascia with a figure-of-eight silk suture.  The  sewing sleeve was also secured with silk suture.  The pocket was then made  with electrocautery, and kanamycin irrigation was utilized to irrigate the  pocket.  The Medtronic maximal model 7232 single chamber defibrillator  serial number [id number], was connected to the defibrillation lead and  placed in the subcutaneous pocket.  Generator secured with silk suture.  Additional kanamycin was then utilized to irrigate the pocket, and the  incision was then closed with a layer of 2-0 Vicryl.  Defibrillation  threshold testing was carried out.   After the patient was deeply sedated with fentanyl and Versed, VF was  induced with T-wave shock, and a 15 joules shock was delivered which  terminated VF and restored sinus rhythm.  Five minutes was allowed to elapse  and a second DFT test carried out.  Again VF was induced with a T-wave shock  and, again, a 15  joules shock was delivered which terminated VF and then  restored sinus rhythm.  With these satisfactory parameters, the incision was  closed with a layer of 2-0 Vicryl followed by a layer of 3-0 Vicryl, and  Benzoin and Steri-Strips were placed on the skin, and a pressure dressing  was placed, and the patient was returned to his room in satisfactory  condition.   COMPLICATIONS:  There are no immediate procedure complications.   RESULTS:  This demonstrated successful implantation of a Medtronic single  chamber defibrillator in a patient with an ischemic cardiomyopathy, class II  heart failure, ejection fraction of 20%.           ______________________________  [name]. [name], M.D.     GWT/MEDQ  D:  09/21/2005  T:  09/21/2005  Job:  [id number]   cc:   Charlton Haws, M.D.  Candyce Churn, M.D.
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
## 2010-07-04 NOTE — Discharge Summary (Signed)
NAME:  [name], [name]                ACCOUNT NO.:  [id number]   MEDICAL RECORD NO.:  [id number]          PATIENT TYPE:  INP   LOCATION:  2030                         FACILITY:  [facility]   PHYSICIAN:  Salvatore Decent. Cornelius Moras, M.D. DATE OF BIRTH:  1950-03-20   DATE OF ADMISSION:  06/09/2005  DATE OF DISCHARGE:  06/21/2005                                 DISCHARGE SUMMARY   ADDENDUM:  This is an addendum to the patient's discharge summary dictated  [id number].  The patient was planned for discharge May3,2007.  Discharge was  cancelled due to the patient's conversion back into atrial fibrillation.  At  that time the patient's heart rate was in the 120s.  The patient's  amiodarone was increased.  The patient was already on Coumadin prior.  INR  was not therapeutic at that time.  Over next several days, the patient  remained in atrial fibrillation.  Cardiology was consulted.  The patient was  seen by Dr. Eden Emms.  Dr. Eden Emms felt that the patient will need to be  therapeutic prior to discharge home.  He will follow up with the patient in  several weeks for consideration of cardioversion as well as possible  implantation of an AICD device.  The patient's Coreg was also increased at  that time.  During these several days, the patient remained asymptomatic.  He was out of bed ambulating well.  The patient was tolerating regular diet  well.  The patient denied any palpitations noted.   Plan is to tentatively discharge the patient over next 1-2 days as long as  INR becomes therapeutic.  Again he will follow up with Dr. Eden Emms in his  office [phone] to obtain a PT/INR blood work level.  He will follow up in  2 weeks for postoperative care and then at that time possible discussion of  an AICD and cardioversion.  Mr. Borre received Coumadin education prior to  discharge home.  All discharge instruction same as dictated prior.   DISCHARGE MEDICATIONS:  1.  Aspirin 81 mg daily.  2.  Coreg 12.5 mg b.i.d.  3.   Lisinopril 5 mg b.i.d.  4.  Zocor 20 mg daily.  5.  Coumadin dose will be pending patient's discharge INR level.  6.  Amiodarone 200 mg b.i.d.  7.  Lasix 40 mg daily x1 week.  8.  Potassium chloride 20 mEq daily x1 week.  9.  Glyburide 10 mg b.i.d.  10. Lantus 35 units daily.  11. Actos 45 mg daily.      Theda Belfast, PA      Salvatore Decent. Cornelius Moras, M.D.  Electronically Signed    KMD/MEDQ  D:  06/21/2005  T:  06/22/2005  Job:  [id number]   cc:   Charlton Haws, M.D.  [address]  [address]  [place]  [place] [address]

## 2010-07-04 NOTE — Letter (Signed)
June 14, 2006    Dr. Simona Fernandez  [address]  [place], [place] [address]   RE:  [name]  MRN:  [id number]  /  DOB:  January 11, 1951   Dear [name],   It was a pleasure to see [name] at your request. Unfortunately his  chart and I have become separated so this is just a brief note. As you  know he is a 60 year old gentleman who underwent bypass surgery a year  ago with mitral valve repair and left atrial appendage oversewing. He  subsequently underwent ICD implantation after post bypass assessment of  his LV ejection fraction failed to demonstrate improvement and received  a single chamber defibrillation.   His perioperative course was also notable for atrial fibrillation and  the electrocardiograms in May of 2007 demonstrated atrial flutter for  which he underwent cardioversion. He was on amiodarone for a long period  of time until it was stopped by Dr. Eden Fernandez in March of this year.   He developed a cold  about a week and a half ago and noted that his  heart rate was up by his home monitor. He saw Dr. Kevan Fernandez who confirmed  tachycardia with an electrocardiogram that demonstrated atrial flutter.  He saw you on Friday and you agreed with that assessment and referred  him for consideration of flutter ablation.   The patient has had some increasing problems with fluid over the last  couple of days, mild increase in his exercise intolerance. His other  major complaint is right knee swelling and pain.   His thromboembolic risk factors are notable for diabetes and LV  dysfunction.   His medications include;  1. Aspirin.  2. Glyburide 5 b.i.d.  3. Simvastatin 20.  4. Furosemide 40.  5. __________.  6. Lantus.  7. Lisinopril 10.  8. Glucophage 500 b.i.d.  9. Cordarone recently reinitiated at a dose of 200 mg twice daily.   He has no known drug allergies.   REVIEW OF SYSTEMS:  Noncontributory.   SOCIAL HISTORY:  Noncontributory.   On  examination, his blood pressure is 132/89, his pulse is 115.  HEENT: Demonstrated __________ the neck veins were flat. Carotids were  brisk and full bilaterally without bruits.  BACK:  Without kyphosis, scoliosis.  LUNGS: Clear.  HEART SOUNDS: Regular with  a 2/6 murmur at the apex.  ABDOMEN: Soft with active bowel sounds without midline pulsation.  Femoral pulses were 2 plus. Distal pulses were intact. There was no  clubbing, cyanosis. There was 1 + peripheral edema. His right knee was  swollen.   Electrocardiogram demonstrated atrial flutter with 2 to 1 block.   IMPRESSION:  1. Atrial flutter-recurrent.  2. Ischemic cardiomyopathy.      a.     Status post coronary artery bypass grafting.      b.     Status post mitral valve repair.      c.     Depressed left ventricular function.  3. Status post ICD.  4. Thromboembolic risk factors notable for left ventricular      dysfunction and diabetes.  5. Acute right swelling of his knee, question gout.   [name], [name] is not likely going to tolerate his atrial flutter with  a rapid rate with his left ventricular dysfunction. Because of that I  have taken the liberty of trying to accelerate an evaluation therapy. I  thank you so much for having started him on Coumadin last week.  We will  plan this week to undertake __________  guided flutter ablation on  Friday.   I have reviewed with the patient the potential benefits as well as the  potential risks included but not limited to death, perforation, vascular  injury. He understands these risks and is willing to proceed.   I have also spoken with Dr. Kevan Fernandez who will see him this afternoon about  his acute knee swelling.    Sincerely,      Kyle Salvia, MD, [facility]  Electronically Signed    SCK/MedQ  DD: 06/14/2006  DT: 06/14/2006  Job #: [id number]   CC:   Kyle Pick. Eden Emms, MD, [facility]  [name], M.D.

## 2010-07-04 NOTE — Discharge Summary (Signed)
[name], [name]                ACCOUNT NO.:  [id number]   MEDICAL RECORD NO.:  [id number]          PATIENT TYPE:  OIB   LOCATION:  2899                         FACILITY:  [facility]   PHYSICIAN:  Charlton Haws, M.D.     DATE OF BIRTH:  01-13-1951   DATE OF ADMISSION:  07/14/2005  DATE OF DISCHARGE:  07/14/2005                                 DISCHARGE SUMMARY   HISTORY OF PRESENT ILLNESS:  This 60 year old patient of myself is recently  status post CABG for ischemic cardiomyopathy with an EF of 20-25%.  The  patient was in postop atrial fibrillation/flutter.  We did a T-cardioversion  prior to discharge.   Unfortunately, the patient reverted to atrial fibrillation as an outpatient.  He had increasing fluid retention and congestive heart failure.  We adjusted  his diabetes medicine and stopped his Actos.  We increased his Lasix.   The patient subsequently was arranged by Dr. Antoine Poche to have a  cardioversion.   HOSPITAL COURSE:  The patient's INR at the time of cardioversion was 1.9,  however, he had been therapeutic and we had planned on giving him extra  Coumadin today.  He is on amiodarone and we are somewhat reluctant to  increase his dose too much.  He was taking 7.5 mg a day.   The patient was anesthetized with 150 mg of sodium Pentothal.  Two biphasic  shocks were given.  The patient converted to sinus rhythm at a rate of 68.   He tolerated the procedure well.  We will give him 10 mg of p.o. Coumadin  before discharge.  I will also take the liberty of giving him 150 of IV  amiodarone in the immediate post cardioversion period to help decrease the  chance of reversion to atrial fibrillation.   Will see him back in the office Thursday or Friday, both for a Coumadin  check and the clinical evaluation.   At some point in time, the patient will need a desynchrony echocardiogram  and to be seen by EP by defibrillator.           ______________________________  Charlton Haws,  M.D.     PN/MEDQ  D:  07/14/2005  T:  07/14/2005  Job:  [id number]

## 2010-07-04 NOTE — Op Note (Signed)
NAME:  [name], [name] NO.:  [id number]   MEDICAL RECORD NO.:  [id number]          PATIENT TYPE:  INP   LOCATION:  2301                         FACILITY:  [facility]   PHYSICIAN:  Judie Petit, M.D. DATE OF BIRTH:  1950-02-22   DATE OF PROCEDURE:  06/11/2005  DATE OF DISCHARGE:                                 OPERATIVE REPORT   [name] is a 60 year old male who presents today for coronary artery  bypass grafting and mitral valve replacement by Dr. Tressie Stalker.  The  patient was brought to the holding area the morning of surgery where  pulmonary artery and arterial lines were placed with local anesthesia  without difficulty. The patient was subsequently taken to the operating room  where general anesthesia was performed. After intubation with the  endotracheal tube, the transesophageal echocardiogram probe was passed down  the oropharynx after being heavily lubricated into a sleeve. There were no  difficulties.   PRE-CORONARY ARTERY BYPASS GRAFTING AND MITRAL VALVE REPAIR:  Left  ventricle:  There was significant decrease in left ventricular function  noted on exam. There was global hypokinesis particularly of the anterior  wall. Papillary muscles were well outlined. There were no masses noted  within the left ventricular chamber.   Mitral valve:  Mitral regurgitant flow on Doppler examination.  There was no  significant reversal flow on pulse wave examination.   Left atrium:  Left atrial appendage revealed no masses. Intra-atrial septum  was intact.   Aortic valve:  The aortic valve appeared trileaflet in nature. There was no  significant aortic regurgitant flow on Doppler examination.   Right ventricle:  Overall right ventricle appeared within normal limits.  There appeared to be no significant tricuspid  regurgitant flow on Doppler  examination.   POST BYPASS EXAMINATION:  Left ventricle:  The left ventricle had  improvement of contractility. Again,  there were no masses noted within the  chamber. Initially there was decrease volume noted, but with replacement of  volume to contract pattern improved.   Mitral valve:  Mitral valve ring intact. There was no significant  regurgitant flow noted.   The rest of the cardiac exam was as previously described. The TEE probe was  subsequently removed intact and there was no difficulty.           ______________________________  Judie Petit, M.D.     CE/MEDQ  D:  06/11/2005  T:  06/11/2005  Job:  [id number]

## 2010-07-04 NOTE — Discharge Summary (Signed)
NAME:  [name], [name]                ACCOUNT NO.:  [id number]   MEDICAL RECORD NO.:  [id number]          PATIENT TYPE:  INP   LOCATION:  2030                         FACILITY:  [facility]   PHYSICIAN:  Salvatore Decent. Cornelius Moras, M.D. DATE OF BIRTH:  09-Oct-1950   DATE OF ADMISSION:  06/09/2005  DATE OF DISCHARGE:                                 DISCHARGE SUMMARY   DATE OF ANTICIPATED DISCHARGE:  Jun 18, 2005.   PRIMARY ADMITTING DIAGNOSES:  1.  Coronary artery disease.  2.  Non-Q wave myocardial infarction.   ADDITIONAL/DISCHARGE DIAGNOSES:  1.  Coronary artery disease.  2.  Non-Q wave myocardial infarction.  3.  Hypertension.  4.  Type 2 diabetes mellitus.  5.  Hyperlipidemia.  6.  Moderate ischemic mitral regurgitation.  7.  Ischemic cardiomyopathy with class IV congestive heart failure.  8.  Postoperative atrial fibrillation.   PROCEDURES PERFORMED:  1.  Coronary artery bypass grafting x4 (left internal mammary artery to the      distal LAD, saphenous vein graft to the second diagonal, saphenous vein      graft to the circumflex marginal, saphenous vein graft to the left      posterolateral branch).  2.  Mitral valve annuloplasty with 26-mm Edwards IMR ET-Logics annuloplasty      ring.  3.  Endoscopic vein harvest right thigh and leg  4.  Placement of right femoral arterial line.   HISTORY:  The patient is a 60 year old male who was admitted to [facility] in transfer from [facility] on June 09, 2005.  Approximately 48 hours prior to admission, he developed crushing severe  chest pain at rest with associated shortness of breath.  He was seen in the  ER at [facility] and was subsequently ruled in for a non-Q myocardial  infarction.  He underwent cardiac catheterization, on June 08, 2005, which  showed significant three-vessel coronary artery disease and severe left  ventricular dysfunction with an EF estimated at 20-25%.  Because of the  severity of his disease, he was  transferred to [facility] under the  care of Dr. Cornelius Moras for further management.   HOSPITAL COURSE:  He was admitted to [facility] on June 09, 2005.  He was continued on IV heparin, nitroglycerin, and aspirin, and was started  on a low dose ACE inhibitor and a low dose beta-blocker.  He underwent a 2D  echocardiogram which showed a dilated left ventricle with severe left  ventricular dysfunction and an ejection fraction estimated at 15 to 20%.  He  developed one episode of nonsustained supraventricular tachycardia and was  started on amiodarone.  He was also noted to have moderate mitral  regurgitation on his 2D echo.  Dr. Cornelius Moras saw the patient and reviewed all his  studies and felt that his best course of action would be to proceed with  surgical revascularization at this time.  It was also felt that he should  undergo an intraoperative transesophageal echocardiogram for further  evaluation of his mitral valve and that if his mitral regurgitation was  significant, he would  require a mitral annuloplasty.  He explained the  risks, benefits, and alternatives of surgery to the patient and Mr. Song  agreed to proceed.  He underwent a complete preoperative workup including  carotid Doppler studies which showed no significant ICA stenosis bilaterally  and lower extremity Doppler studies which showed normal ABIs.  The patient  did have one episode of recurrent chest pain which improved but was not  resolved with IV nitroglycerin.  A 12-lead EKG was performed which showed no  acute changes.  Because of this, he proceeded urgently to the operating  room, on June 11, 2005, where he underwent CABG x4 and mitral valve repair  as described in detail above.  He tolerated the procedure well and was  transferred to the SICU in stable condition.  He did develop an additional  brief episode of supraventricular tachycardia and was started on an  amiodarone drip.  He was able to be extubated  shortly after surgery.  He was  hemodynamic stable and doing well on post-op day one.  He was continued on a  low dose dopamine drip as well as milrinone  and Lasix.  He remained in the  unit for further observation.  He was slowly mobilized with cardiac rehab  phase I.  By post-op day two, he was off all drips and was remaining stable.  He did have a recurrence of atrial fibrillation and required atrial pacing  and continuation of his amiodarone drip.  He was restarted on a beta-blocker  and an ACE inhibitor.  By post-op day four, he was ready for transfer to the  floor.  Overall postoperatively, he has done fairly well.  He ultimately  converted to normal sinus rhythm and his external pacer was discontinued.  He was started on Coumadin for anticoagulation for his atrial fibrillation  and his mitral valve ring.  He remained somewhat volume overloaded and was  continued on a p.o. dose of Lasix.  He has remained afebrile and all vital  signs have been stable.  His O2 sats have remained greater than 90% on room  air.  His blood sugars have remained fairly stable and he has been restarted  on his home medications.  The doses of his home medicines for his blood  sugars have been titrated upward and presently he is running somewhere in  the 80s to 160 range.  His anticoagulation is ongoing and his INR presently  is 1.4.   The remainder of his most recent labs, on Jun 16, 2005, show a hemoglobin of  10.8, hematocrit 31, platelets 186, white count 10.3.  Sodium 137, potassium  3.2 which has been supplemented, BUN 25, creatinine 1.1.  His chest x-ray  shows basilar atelectasis.   PHYSICAL EXAMINATION:  MUSCULOSKELETAL:  He remains edematous in his lower  extremities.  CHEST:  He has a small amount of sternal drainage which is being monitored  closely but is improving overall.  HEART:  He is maintaining a normal sinus rhythm.  LUNGS:  Clear.  It is anticipated if he remains stable over the next  24 hours, he will  hopefully be ready for discharge home on Jun 18, 2005.   DISCHARGE MEDICATIONS:  1.  Aspirin 81 mg daily.  2.  Coumadin home dose will be determined by a PT and INR drawn on the date      of discharge.  3.  Coreg 3.125 mg b.i.d.  4.  Lisinopril 5 mg b.i.d.  5.  Zocor 20  mg q.h.s.  6.  Amiodarone 200 mg b.i.d.  7.  Lasix 40 mg every day x1 week.  8.  K-Dur 20 mEq every day x1 week.  9.  Glyburide 10 mg b.i.d.  10. Lantus 35 units q.h.s.  11. Actos 45 mg every day.  12. Tylox 1 to 2 q.4h. p.r.n. for pain.   DISCHARGE INSTRUCTIONS:  1.  He is asked to refrain from driving, heavy lifting, or strenuous      activity.  2.  He may continue ambulating daily and using his incentive spirometer.  3.  He may shower and clean his incisions with soap and water.  4.  He will continue a low fat, low sodium, carbohydrate modified diet.   DISCHARGE FOLLOWUP:  He will need to make an appointment to see Dr. Juliann Pares  in two weeks.  And he will have a chest x-ray at that visit.  He will see  Dr. Cornelius Moras on Jul 06, 2005 at 2:30 p.m.  He will need to have a PT and INR  drawn at Dr. Glennis Brink office within 48 hours of the time of discharge for  maintenance of his Coumadin.  He will call our office in the interim if he  experiences any problems or has questions.      Coral Ceo, P.A.      Salvatore Decent. Cornelius Moras, M.D.  Electronically Signed    GC/MEDQ  D:  06/17/2005  T:  06/18/2005  Job:  [id number]   cc:   Dorothyann Peng, Dr.   Candyce Churn, M.D.  Fax: [phone]

## 2010-07-04 NOTE — Discharge Summary (Signed)
NAME:  [name], [name]                ACCOUNT NO.:  [id number]   MEDICAL RECORD NO.:  [id number]          PATIENT TYPE:  OIB   LOCATION:  4735                         FACILITY:  [facility]   PHYSICIAN:  Maple Mirza, P.A. DATE OF BIRTH:  [birth year]   DATE OF ADMISSION:  09/21/2005  DATE OF DISCHARGE:  09/22/2005                                 DISCHARGE SUMMARY   ALLERGIES:  THIS PATIENT HAS AN ALLERGY TO SULFA.   DISCHARGE DIAGNOSES:  1.  Discharging day 1 status post implant of Medtronic MAXIMO single chamber      cardioverted defibrillator.  2.  Ischemic cardiomyopathy ejection fraction 20%.      1.  History of myocardial infarction April 2007.      2.  Three vessel coronary artery disease status post coronary artery          bypass graft surgery, mitral valve annuloplasty April 2007.  3.  Postoperative atrial fibrillation.  The patient is now on Coumadin and      amiodarone.  4.  Class II congestive heart failure.  5.  Hypertension.  6.  Dyslipidemia.  7.  Diabetes.   PROCEDURE:  September 21, 2005 implant of Medtronic [facility] single chamber  cardioverted defibrillator Dr. Lewayne Bunting.  The patient had no post  procedural complications, no hematoma.  Chest x-ray showed no evidence of  pneumothorax.  The lead was in appropriate position.  The device has been  interrogated post procedurally on day #1.  All values within normal limits  and no changes were made.  Mobility of the left arm has been described to  the patient.  He is asked to keep his incision dry for the next 7 days and  to sponge bathe until Monday, September 28, 2005.   Of note.. The patient has been complaining of intermittent swelling in the  right knee which has occurred ever since saphenous vein graft harvest using  endoscopic procedure.  It does not happen all the time but when it comes on,  he says he can hardly walk on it.  This problem dissipates usually after 1-2  days.  He says it is not becoming more frequent  and is not particularly  painful when he is not having these intermittent discomforts.  He is asked  to followup with his primary care physician for this.  He may be  experiencing intermittent effusion of some kind however it is hard to  imagine saphenous vein graft harvest being responsible for this.   The patient discharged on the following medications;  1.  Coreg 12.5 mg twice daily.  2.  Coumadin 7.5 mg daily.  3.  Glyburide 10 mg twice daily.  4.  Simvastatin 40 mg daily at bedtime.  5.  Amiodarone 200 mg 2 tablets in the morning, 2 tablets in the evening.  6.  Enteric coated aspirin 81 mg daily.  7.  Lisinopril 5 mg twice daily.  8.  Furosemide 40 mg daily.  9.  Potassium chloride 20 mEq daily.  10. Lantus per sliding scale.   The patient has followup;  He sees  Dr. Ellyn Hack Thursday, October 01, 2005 at  9:15 in the morning in the [facility] at 8:45 in the morning.  He will  followup with the [facility] at [facility] [address]  Monday October 12, 2005 at 9:00.  He will see Dr. [name] Tuesday, January 05, 2006 at 11:10.   BRIEF HISTORY AND PHYSICAL:  [name] is a 60 year old male.  He has  ischemic cardiomyopathy and severe left ventricular dysfunction.  He had a  myocardial infarction in April.  At that time he underwent bypass surgery as  well as mitral valve annuloplasty.   The patient did develop postoperative atrial fibrillation.  He has been  placed on amiodarone and Coumadin.  He had cardioversion back in June 2007  and has been maintaining sinus rhythm.  The patient has dyspnea on exertion  but fairly mild congestive heart failure symptoms class II.  The patient has  no history of syncope.   Mr. Hostetler is stable.  He is a candidate for prophylactic ICD implantation.  His QRS is narrow.  His class of congestive heart failure is Class II.  He  is not a candidate at this present time for BvI ICD implantation.   HOSPITAL COURSE:  The  patient presented electively on September 21, 2005 for an  implantation of single chamber cardioverted defibrillator.  This was done by  Dr. [name] as described above without complications.  The patient  discharging day #1 status post procedure.  We did spend some time chatting  about his right knee which was intermittently swollen, tender and hampers  his walking and he was asked to seek some assistance from his primary care  physician, he may need imaging of this knee at some time.   LABORATORY DATA:  September 21, 2005 sodium 141, potassium 4.3, chloride 107,  carbonate 25, glucose 171, BUN 14, creatinine 1.  The PT was 21, INR 1.7.  Complete blood count white cells 9.3, hemoglobin 16, hematocrit 47.5,  platelets 194.      Maple Mirza, P.A.     GM/MEDQ  D:  09/22/2005  T:  09/22/2005  Job:  [id number]   cc:   [name]. [name], M.D.

## 2010-07-11 ENCOUNTER — Encounter: Payer: Self-pay | Admitting: Internal Medicine

## 2010-07-23 ENCOUNTER — Encounter: Payer: Self-pay | Admitting: Internal Medicine

## 2010-07-23 ENCOUNTER — Ambulatory Visit (INDEPENDENT_AMBULATORY_CARE_PROVIDER_SITE_OTHER): Payer: 59 | Admitting: Internal Medicine

## 2010-07-23 DIAGNOSIS — I428 Other cardiomyopathies: Secondary | ICD-10-CM

## 2010-07-23 DIAGNOSIS — I5022 Chronic systolic (congestive) heart failure: Secondary | ICD-10-CM

## 2010-07-23 DIAGNOSIS — I4891 Unspecified atrial fibrillation: Secondary | ICD-10-CM

## 2010-07-23 DIAGNOSIS — I509 Heart failure, unspecified: Secondary | ICD-10-CM

## 2010-07-23 DIAGNOSIS — Z95810 Presence of automatic (implantable) cardiac defibrillator: Secondary | ICD-10-CM

## 2010-07-23 DIAGNOSIS — I2589 Other forms of chronic ischemic heart disease: Secondary | ICD-10-CM

## 2010-07-23 NOTE — Assessment & Plan Note (Signed)
His device is working normally. No intercurrent ICD shocks. We'll recheck in several months.

## 2010-07-23 NOTE — Assessment & Plan Note (Signed)
His chronic systolic heart failure is well controlled. He will continue a low-sodium diet. He does have a bit of peripheral edema have recommended that he increase his Lasix for 3 days.

## 2010-07-23 NOTE — Patient Instructions (Signed)
Your physician wants you to follow-up in: 12 months with Dr Court Joy will receive a reminder letter in the mail two months in advance. If you don't receive a letter, please call our office to schedule the follow-up appointment.  Your physician has recommended you make the following change in your medication: increase Furosemide to 2 tablets daily for 3 days then go back to once daily
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
## 2010-10-23 ENCOUNTER — Encounter: Payer: 59 | Admitting: *Deleted

## 2010-10-30 ENCOUNTER — Encounter: Payer: Self-pay | Admitting: *Deleted

## 2010-11-01 ENCOUNTER — Encounter: Payer: Self-pay | Admitting: Internal Medicine

## 2010-11-01 ENCOUNTER — Other Ambulatory Visit: Payer: Self-pay | Admitting: Internal Medicine

## 2010-11-03 ENCOUNTER — Ambulatory Visit (INDEPENDENT_AMBULATORY_CARE_PROVIDER_SITE_OTHER): Payer: 59 | Admitting: *Deleted

## 2010-11-03 DIAGNOSIS — I428 Other cardiomyopathies: Secondary | ICD-10-CM

## 2010-11-03 DIAGNOSIS — I509 Heart failure, unspecified: Secondary | ICD-10-CM

## 2010-11-05 ENCOUNTER — Encounter: Payer: Self-pay | Admitting: *Deleted

## 2010-11-14 NOTE — Progress Notes (Signed)
ICD remote checked by remote

## 2011-02-05 ENCOUNTER — Encounter: Payer: 59 | Admitting: *Deleted

## 2011-02-13 ENCOUNTER — Encounter: Payer: Self-pay | Admitting: *Deleted

## 2011-02-18 ENCOUNTER — Other Ambulatory Visit: Payer: Self-pay | Admitting: Internal Medicine

## 2011-02-18 ENCOUNTER — Encounter: Payer: Self-pay | Admitting: Internal Medicine

## 2011-02-19 ENCOUNTER — Ambulatory Visit (INDEPENDENT_AMBULATORY_CARE_PROVIDER_SITE_OTHER): Payer: 59 | Admitting: *Deleted

## 2011-02-19 DIAGNOSIS — I428 Other cardiomyopathies: Secondary | ICD-10-CM

## 2011-02-21 LAB — REMOTE ICD DEVICE
BRDY-0002RV: 40 {beats}/min
CHARGE TIME: 8.53 s
DEV-0020ICD: NEGATIVE
TOT-0006: 20120915000000
TZAT-0001FASTVT: 1
TZAT-0001FASTVT: 1
TZAT-0001SLOWVT: 1
TZAT-0001SLOWVT: 1
TZAT-0001SLOWVT: 2
TZAT-0004FASTVT: 8
TZAT-0004SLOWVT: 8
TZAT-0004SLOWVT: 8
TZAT-0004SLOWVT: 8
TZAT-0005FASTVT: 88 pct
TZAT-0005SLOWVT: 84 pct
TZAT-0011FASTVT: 10 ms
TZAT-0011SLOWVT: 10 ms
TZAT-0012FASTVT: 200 ms
TZAT-0012SLOWVT: 200 ms
TZAT-0012SLOWVT: 200 ms
TZAT-0013FASTVT: 1
TZAT-0013SLOWVT: 2
TZAT-0013SLOWVT: 2
TZAT-0013SLOWVT: 2
TZAT-0018FASTVT: NEGATIVE
TZAT-0018SLOWVT: NEGATIVE
TZAT-0019FASTVT: 8 V
TZAT-0019FASTVT: 8 V
TZAT-0019SLOWVT: 8 V
TZAT-0019SLOWVT: 8 V
TZAT-0020FASTVT: 1.6 ms
TZAT-0020FASTVT: 1.6 ms
TZAT-0020SLOWVT: 1.6 ms
TZAT-0020SLOWVT: 1.6 ms
TZON-0003SLOWVT: 340 ms
TZON-0004SLOWVT: 16
TZON-0004SLOWVT: 16
TZON-0005SLOWVT: 12
TZON-0008FASTVT: 0 ms
TZON-0008SLOWVT: 0 ms
TZON-0010FASTVT: 30 ms
TZON-0010SLOWVT: 30 ms
TZON-0010SLOWVT: 30 ms
TZON-0011AFLUTTER: 70
TZST-0001FASTVT: 2
TZST-0001FASTVT: 3
TZST-0001FASTVT: 4
TZST-0001FASTVT: 6
TZST-0001FASTVT: 6
TZST-0001SLOWVT: 3
TZST-0001SLOWVT: 4
TZST-0001SLOWVT: 5
TZST-0003FASTVT: 35 J
TZST-0003FASTVT: 35 J
TZST-0003FASTVT: 35 J
TZST-0003FASTVT: 35 J
TZST-0003SLOWVT: 25 J
TZST-0003SLOWVT: 35 J
TZST-0003SLOWVT: 35 J

## 2011-02-26 NOTE — Progress Notes (Signed)
Remote icd check  

## 2011-03-04 ENCOUNTER — Encounter: Payer: Self-pay | Admitting: *Deleted

## 2011-05-21 ENCOUNTER — Ambulatory Visit (INDEPENDENT_AMBULATORY_CARE_PROVIDER_SITE_OTHER): Payer: 59 | Admitting: *Deleted

## 2011-05-21 ENCOUNTER — Encounter: Payer: Self-pay | Admitting: Internal Medicine

## 2011-05-21 DIAGNOSIS — I4891 Unspecified atrial fibrillation: Secondary | ICD-10-CM

## 2011-05-21 DIAGNOSIS — I428 Other cardiomyopathies: Secondary | ICD-10-CM

## 2011-05-21 DIAGNOSIS — Z95810 Presence of automatic (implantable) cardiac defibrillator: Secondary | ICD-10-CM

## 2011-05-22 LAB — REMOTE ICD DEVICE
BRDY-0002RV: 40 {beats}/min
CHARGE TIME: 8.66 s
DEV-0020ICD: NEGATIVE
RV LEAD IMPEDENCE ICD: 376 Ohm
TZAT-0001FASTVT: 1
TZAT-0001SLOWVT: 1
TZAT-0001SLOWVT: 2
TZAT-0004SLOWVT: 8
TZAT-0004SLOWVT: 8
TZAT-0005FASTVT: 88 pct
TZAT-0005SLOWVT: 84 pct
TZAT-0011FASTVT: 10 ms
TZAT-0013SLOWVT: 2
TZAT-0013SLOWVT: 2
TZAT-0018FASTVT: NEGATIVE
TZAT-0019FASTVT: 8 V
TZAT-0020SLOWVT: 1.6 ms
TZAT-0020SLOWVT: 1.6 ms
TZON-0008FASTVT: 0 ms
TZON-0010SLOWVT: 30 ms
TZST-0001FASTVT: 2
TZST-0001FASTVT: 3
TZST-0001FASTVT: 6
TZST-0001SLOWVT: 3
TZST-0001SLOWVT: 5
TZST-0003FASTVT: 25 J
TZST-0003FASTVT: 35 J
TZST-0003FASTVT: 35 J
TZST-0003SLOWVT: 25 J
TZST-0003SLOWVT: 35 J

## 2011-05-27 ENCOUNTER — Encounter: Payer: Self-pay | Admitting: *Deleted

## 2011-06-04 NOTE — Progress Notes (Signed)
Remote icd check  

## 2011-07-24 ENCOUNTER — Encounter: Payer: Self-pay | Admitting: Internal Medicine

## 2011-07-24 ENCOUNTER — Ambulatory Visit (INDEPENDENT_AMBULATORY_CARE_PROVIDER_SITE_OTHER): Payer: 59 | Admitting: Internal Medicine

## 2011-07-24 VITALS — BP 90/60 | HR 69 | Ht 73.0 in | Wt 245.8 lb

## 2011-07-24 DIAGNOSIS — I472 Ventricular tachycardia: Secondary | ICD-10-CM

## 2011-07-24 DIAGNOSIS — I509 Heart failure, unspecified: Secondary | ICD-10-CM

## 2011-07-24 DIAGNOSIS — Z95810 Presence of automatic (implantable) cardiac defibrillator: Secondary | ICD-10-CM

## 2011-07-24 DIAGNOSIS — I4891 Unspecified atrial fibrillation: Secondary | ICD-10-CM

## 2011-07-24 DIAGNOSIS — I2589 Other forms of chronic ischemic heart disease: Secondary | ICD-10-CM

## 2011-07-24 LAB — ICD DEVICE OBSERVATION
BATTERY VOLTAGE: 2.99 V
BRDY-0002RV: 40 {beats}/min
CHARGE TIME: 8.66 s
DEV-0020ICD: NEGATIVE
FVT: 2
PACEART VT: 0
RV LEAD AMPLITUDE: 7 mv
RV LEAD IMPEDENCE ICD: 376 Ohm
RV LEAD THRESHOLD: 1 V
TZAT-0001FASTVT: 1
TZAT-0001SLOWVT: 1
TZAT-0001SLOWVT: 2
TZAT-0004FASTVT: 8
TZAT-0004SLOWVT: 8
TZAT-0004SLOWVT: 8
TZAT-0005FASTVT: 88 pct
TZAT-0005SLOWVT: 84 pct
TZAT-0005SLOWVT: 91 pct
TZAT-0011FASTVT: 10 ms
TZAT-0011SLOWVT: 10 ms
TZAT-0011SLOWVT: 10 ms
TZAT-0012FASTVT: 200 ms
TZAT-0012SLOWVT: 200 ms
TZAT-0012SLOWVT: 200 ms
TZAT-0013FASTVT: 1
TZAT-0013SLOWVT: 2
TZAT-0013SLOWVT: 2
TZAT-0018FASTVT: NEGATIVE
TZAT-0018SLOWVT: NEGATIVE
TZAT-0018SLOWVT: NEGATIVE
TZAT-0019FASTVT: 8 V
TZAT-0019SLOWVT: 8 V
TZAT-0019SLOWVT: 8 V
TZAT-0020FASTVT: 1.6 ms
TZAT-0020SLOWVT: 1.6 ms
TZAT-0020SLOWVT: 1.6 ms
TZON-0003FASTVT: 240 ms
TZON-0003SLOWVT: 340 ms
TZON-0004SLOWVT: 36
TZON-0005SLOWVT: 12
TZON-0008FASTVT: 0 ms
TZON-0008SLOWVT: 0 ms
TZON-0010AFLUTTER: 30 ms
TZON-0010FASTVT: 30 ms
TZON-0010SLOWVT: 30 ms
TZON-0010VSLOWVT: 30 ms
TZON-0011AFLUTTER: 70
TZST-0001FASTVT: 2
TZST-0001FASTVT: 3
TZST-0001FASTVT: 4
TZST-0001FASTVT: 5
TZST-0001FASTVT: 6
TZST-0001SLOWVT: 3
TZST-0001SLOWVT: 4
TZST-0001SLOWVT: 5
TZST-0001SLOWVT: 6
TZST-0003FASTVT: 25 J
TZST-0003FASTVT: 35 J
TZST-0003FASTVT: 35 J
TZST-0003FASTVT: 35 J
TZST-0003FASTVT: 35 J
TZST-0003SLOWVT: 15 J
TZST-0003SLOWVT: 25 J
TZST-0003SLOWVT: 35 J
TZST-0003SLOWVT: 35 J
VENTRICULAR PACING ICD: 0 pct
VF: 0

## 2011-07-24 NOTE — Assessment & Plan Note (Signed)
She has had 2 episodes of ventricular tachycardia which have been terminated with pacing.
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
## 2011-07-24 NOTE — Patient Instructions (Signed)
Continue current medications as listed.  Follow up in 1 year with Dr [name].  You will receive a letter in the mail 2 months before you are due.  Please call us when you receive this letter to schedule your follow up appointment.

## 2011-07-24 NOTE — Assessment & Plan Note (Signed)
His device is working normally. We'll plan to recheck in several months. 

## 2011-07-24 NOTE — Assessment & Plan Note (Signed)
He denies anginal symptoms. He will continue his current medical therapy. 

## 2011-07-24 NOTE — Assessment & Plan Note (Signed)
His heart failure is class II. His blood pressure is a bit on the low side today. The patient denies dizziness. I've encouraged him to notify us if he feels this way we will back off on some of his medications. He will maintain a low-sodium diet.

## 2011-10-29 ENCOUNTER — Encounter: Payer: 59 | Admitting: *Deleted

## 2011-11-13 ENCOUNTER — Encounter: Payer: Self-pay | Admitting: *Deleted

## 2011-11-23 ENCOUNTER — Ambulatory Visit (INDEPENDENT_AMBULATORY_CARE_PROVIDER_SITE_OTHER): Payer: 59 | Admitting: *Deleted

## 2011-11-23 DIAGNOSIS — I472 Ventricular tachycardia, unspecified: Secondary | ICD-10-CM

## 2011-11-23 DIAGNOSIS — Z95810 Presence of automatic (implantable) cardiac defibrillator: Secondary | ICD-10-CM

## 2011-11-26 LAB — REMOTE ICD DEVICE
BRDY-0002RV: 40 {beats}/min
RV LEAD AMPLITUDE: 7 mv
TOT-0006: 20130607000000
TZAT-0001FASTVT: 1
TZAT-0004FASTVT: 8
TZAT-0004SLOWVT: 8
TZAT-0004SLOWVT: 8
TZAT-0005FASTVT: 88 pct
TZAT-0005SLOWVT: 84 pct
TZAT-0005SLOWVT: 91 pct
TZAT-0011SLOWVT: 10 ms
TZAT-0011SLOWVT: 10 ms
TZAT-0012FASTVT: 200 ms
TZAT-0019SLOWVT: 8 V
TZAT-0019SLOWVT: 8 V
TZAT-0020FASTVT: 1.6 ms
TZON-0008FASTVT: 0 ms
TZON-0010AFLUTTER: 30 ms
TZON-0011AFLUTTER: 70
TZST-0001FASTVT: 2
TZST-0001FASTVT: 4
TZST-0001FASTVT: 5
TZST-0001SLOWVT: 4
TZST-0003FASTVT: 25 J
TZST-0003FASTVT: 35 J
TZST-0003SLOWVT: 15 J
TZST-0003SLOWVT: 35 J

## 2011-12-03 ENCOUNTER — Encounter: Payer: Self-pay | Admitting: *Deleted

## 2011-12-14 ENCOUNTER — Encounter: Payer: Self-pay | Admitting: Internal Medicine

## 2012-02-29 ENCOUNTER — Encounter: Payer: 59 | Admitting: *Deleted

## 2012-03-07 ENCOUNTER — Encounter: Payer: Self-pay | Admitting: *Deleted

## 2012-07-20 ENCOUNTER — Inpatient Hospital Stay (HOSPITAL_COMMUNITY)
Admission: EM | Admit: 2012-07-20 | Discharge: 2012-07-20 | DRG: 933 | Disposition: A | Discharge status: Other Acute Inpatient Hospital | Payer: No Typology Code available for payment source | Attending: Emergency Medicine | Admitting: Emergency Medicine

## 2012-07-20 ENCOUNTER — Encounter (HOSPITAL_COMMUNITY): Payer: Self-pay | Admitting: Emergency Medicine

## 2012-07-20 ENCOUNTER — Emergency Department (HOSPITAL_COMMUNITY): Payer: No Typology Code available for payment source

## 2012-07-20 DIAGNOSIS — F17200 Nicotine dependence, unspecified, uncomplicated: Secondary | ICD-10-CM | POA: Diagnosis present

## 2012-07-20 DIAGNOSIS — I5021 Acute systolic (congestive) heart failure: Secondary | ICD-10-CM | POA: Diagnosis present

## 2012-07-20 DIAGNOSIS — L97509 Non-pressure chronic ulcer of other part of unspecified foot with unspecified severity: Secondary | ICD-10-CM | POA: Diagnosis present

## 2012-07-20 DIAGNOSIS — Z7901 Long term (current) use of anticoagulants: Secondary | ICD-10-CM

## 2012-07-20 DIAGNOSIS — T24339A Burn of third degree of unspecified lower leg, initial encounter: Principal | ICD-10-CM | POA: Diagnosis present

## 2012-07-20 DIAGNOSIS — I4891 Unspecified atrial fibrillation: Secondary | ICD-10-CM | POA: Diagnosis present

## 2012-07-20 DIAGNOSIS — I2510 Atherosclerotic heart disease of native coronary artery without angina pectoris: Secondary | ICD-10-CM | POA: Diagnosis present

## 2012-07-20 DIAGNOSIS — Z794 Long term (current) use of insulin: Secondary | ICD-10-CM

## 2012-07-20 DIAGNOSIS — E785 Hyperlipidemia, unspecified: Secondary | ICD-10-CM | POA: Diagnosis present

## 2012-07-20 DIAGNOSIS — Z79899 Other long term (current) drug therapy: Secondary | ICD-10-CM

## 2012-07-20 DIAGNOSIS — Z95810 Presence of automatic (implantable) cardiac defibrillator: Secondary | ICD-10-CM

## 2012-07-20 DIAGNOSIS — E1169 Type 2 diabetes mellitus with other specified complication: Secondary | ICD-10-CM | POA: Diagnosis present

## 2012-07-20 DIAGNOSIS — T3133 Burns involving 30-39% of body surface with 30-39% third degree burns: Secondary | ICD-10-CM | POA: Diagnosis present

## 2012-07-20 DIAGNOSIS — E11621 Type 2 diabetes mellitus with foot ulcer: Secondary | ICD-10-CM

## 2012-07-20 DIAGNOSIS — I2589 Other forms of chronic ischemic heart disease: Secondary | ICD-10-CM | POA: Diagnosis present

## 2012-07-20 DIAGNOSIS — L02619 Cutaneous abscess of unspecified foot: Secondary | ICD-10-CM | POA: Diagnosis present

## 2012-07-20 DIAGNOSIS — L089 Local infection of the skin and subcutaneous tissue, unspecified: Secondary | ICD-10-CM

## 2012-07-20 DIAGNOSIS — Z951 Presence of aortocoronary bypass graft: Secondary | ICD-10-CM

## 2012-07-20 DIAGNOSIS — Y92009 Unspecified place in unspecified non-institutional (private) residence as the place of occurrence of the external cause: Secondary | ICD-10-CM

## 2012-07-20 DIAGNOSIS — Z7982 Long term (current) use of aspirin: Secondary | ICD-10-CM

## 2012-07-20 DIAGNOSIS — W899XXA Exposure to unspecified man-made visible and ultraviolet light, initial encounter: Secondary | ICD-10-CM

## 2012-07-20 DIAGNOSIS — L03119 Cellulitis of unspecified part of limb: Secondary | ICD-10-CM | POA: Diagnosis present

## 2012-07-20 DIAGNOSIS — I509 Heart failure, unspecified: Secondary | ICD-10-CM | POA: Diagnosis present

## 2012-07-20 LAB — CBC WITH DIFFERENTIAL/PLATELET
Basophils Absolute: 0 K/uL (ref 0.0–0.1)
Basophils Relative: 0 % (ref 0–1)
Eosinophils Absolute: 0.1 K/uL (ref 0.0–0.7)
Eosinophils Relative: 1 % (ref 0–5)
HCT: 42.7 % (ref 39.0–52.0)
Hemoglobin: 15 g/dL (ref 13.0–17.0)
Lymphocytes Relative: 6 % — ABNORMAL LOW (ref 12–46)
Lymphs Abs: 0.5 K/uL — ABNORMAL LOW (ref 0.7–4.0)
MCH: 29.6 pg (ref 26.0–34.0)
MCHC: 35.1 g/dL (ref 30.0–36.0)
MCV: 84.2 fL (ref 78.0–100.0)
Monocytes Absolute: 0.7 K/uL (ref 0.1–1.0)
Monocytes Relative: 7 % (ref 3–12)
Neutro Abs: 7.9 K/uL — ABNORMAL HIGH (ref 1.7–7.7)
Neutrophils Relative %: 85 % — ABNORMAL HIGH (ref 43–77)
Platelets: 254 K/uL (ref 150–400)
RBC: 5.07 MIL/uL (ref 4.22–5.81)
RDW: 16.7 % — ABNORMAL HIGH (ref 11.5–15.5)
WBC: 9.3 K/uL (ref 4.0–10.5)

## 2012-07-20 LAB — BASIC METABOLIC PANEL WITH GFR
BUN: 24 mg/dL — ABNORMAL HIGH (ref 6–23)
CO2: 26 meq/L (ref 19–32)
Calcium: 8.2 mg/dL — ABNORMAL LOW (ref 8.4–10.5)
Chloride: 97 meq/L (ref 96–112)
Creatinine, Ser: 0.94 mg/dL (ref 0.50–1.35)
GFR calc Af Amer: >90 mL/min (ref >90)
GFR calc non Af Amer: 88 mL/min — ABNORMAL LOW (ref >90)
Glucose, Bld: 157 mg/dL — ABNORMAL HIGH (ref 70–99)
Potassium: 4.7 meq/L (ref 3.5–5.1)
Sodium: 131 meq/L — ABNORMAL LOW (ref 135–145)

## 2012-07-20 LAB — TROPONIN I: Troponin I: <0.3 ng/mL (ref <0.30)

## 2012-07-20 MED ORDER — SODIUM CHLORIDE 0.9 % IV BOLUS (SEPSIS)
500.0000 mL | Freq: Once | INTRAVENOUS | Status: AC
  Administered 2012-07-20: 500 mL via INTRAVENOUS

## 2012-07-20 MED ORDER — KETOROLAC TROMETHAMINE 30 MG/ML IJ SOLN
30.0000 mg | Freq: Once | INTRAMUSCULAR | Status: AC
  Administered 2012-07-20: 30 mg via INTRAVENOUS
  Filled 2012-07-20: qty 1

## 2012-07-20 MED ORDER — FUROSEMIDE 10 MG/ML IJ SOLN
40.0000 mg | Freq: Once | INTRAMUSCULAR | Status: AC
  Administered 2012-07-20: 40 mg via INTRAVENOUS
  Filled 2012-07-20: qty 4

## 2012-07-20 MED ORDER — MORPHINE SULFATE 4 MG/ML IJ SOLN
4.0000 mg | Freq: Once | INTRAMUSCULAR | Status: AC
  Administered 2012-07-20: 4 mg via INTRAVENOUS
  Filled 2012-07-20: qty 1

## 2012-07-20 MED ORDER — ALBUTEROL SULFATE (5 MG/ML) 0.5% IN NEBU
5.0000 mg | INHALATION_SOLUTION | Freq: Once | RESPIRATORY_TRACT | Status: AC
  Administered 2012-07-20: 5 mg via RESPIRATORY_TRACT
  Filled 2012-07-20: qty 1

## 2012-07-20 MED ORDER — PIPERACILLIN-TAZOBACTAM 3.375 G IVPB
3.3750 g | Freq: Once | INTRAVENOUS | Status: AC
  Administered 2012-07-20: 3.375 g via INTRAVENOUS
  Filled 2012-07-20: qty 50

## 2012-07-20 MED ORDER — VANCOMYCIN HCL IN DEXTROSE 1-5 GM/200ML-% IV SOLN
1000.0000 mg | Freq: Once | INTRAVENOUS | Status: AC
  Administered 2012-07-20: 1000 mg via INTRAVENOUS
  Filled 2012-07-20 (×2): qty 200
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
## 2012-07-20 NOTE — ED Notes (Signed)
Paged Dr. Montez Morita w/[facility] to [phone]
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
## 2012-07-29 NOTE — ED Provider Notes (Signed)
Medical screening examination/treatment/procedure(s) were conducted as a shared visit with non-physician practitioner(s) and myself.  I personally evaluated the patient during the encounter Pt with bil foot/ankle erythema, skin ulceration. Exam c/w cellulitis. Labs. Iv abx. Admit.   Suzi Roots, MD 07/29/12 [phone]

## 2012-10-25 ENCOUNTER — Other Ambulatory Visit: Payer: Self-pay | Admitting: Cardiology

## 2012-10-25 LAB — CBC WITH DIFFERENTIAL/PLATELET
Basophil #: 0.1 10*3/uL (ref 0.0–0.1)
Basophil %: 0.3 %
Eosinophil #: 0.1 10*3/uL (ref 0.0–0.7)
HCT: 35.3 % — ABNORMAL LOW (ref 40.0–52.0)
HGB: 11.7 g/dL — ABNORMAL LOW (ref 13.0–18.0)
Lymphocyte #: 0.4 10*3/uL — ABNORMAL LOW (ref 1.0–3.6)
Lymphocyte %: 2 %
MCV: 82 fL (ref 80–100)
Monocyte #: 0.6 x10 3/mm (ref 0.2–1.0)
Monocyte %: 2.9 %
Platelet: 486 10*3/uL — ABNORMAL HIGH (ref 150–440)

## 2012-10-25 LAB — COMPREHENSIVE METABOLIC PANEL
Albumin: 1.3 g/dL — ABNORMAL LOW (ref 3.4–5.0)
Alkaline Phosphatase: 122 U/L (ref 50–136)
Anion Gap: 8 (ref 7–16)
BUN: 67 mg/dL — ABNORMAL HIGH (ref 7–18)
Bilirubin,Total: 0.2 mg/dL (ref 0.2–1.0)
Calcium, Total: 7.9 mg/dL — ABNORMAL LOW (ref 8.5–10.1)
EGFR (Non-African Amer.): 25 — ABNORMAL LOW
Glucose: 187 mg/dL — ABNORMAL HIGH (ref 65–99)
Osmolality: 287 (ref 275–301)
SGOT(AST): 21 U/L (ref 15–37)
SGPT (ALT): 19 U/L (ref 12–78)

## 2012-10-25 LAB — MAGNESIUM: Magnesium: 1.8 mg/dL

## 2012-10-25 LAB — PROTIME-INR: INR: 5

## 2012-10-28 ENCOUNTER — Ambulatory Visit (HOSPITAL_COMMUNITY)
Admission: EM | Admit: 2012-10-28 | Discharge: 2012-10-28 | Disposition: A | Discharge status: Home / Self Care | Payer: No Typology Code available for payment source | Source: Other Acute Inpatient Hospital | Attending: Surgery | Admitting: Surgery

## 2012-10-28 ENCOUNTER — Emergency Department: Payer: Self-pay | Admitting: Emergency Medicine

## 2012-10-28 DIAGNOSIS — IMO0002 Reserved for concepts with insufficient information to code with codable children: Secondary | ICD-10-CM | POA: Insufficient documentation

## 2012-10-28 DIAGNOSIS — S27329A Contusion of lung, unspecified, initial encounter: Secondary | ICD-10-CM | POA: Insufficient documentation

## 2012-10-28 DIAGNOSIS — W050XXA Fall from non-moving wheelchair, initial encounter: Secondary | ICD-10-CM | POA: Insufficient documentation

## 2012-10-28 DIAGNOSIS — S2239XA Fracture of one rib, unspecified side, initial encounter for closed fracture: Secondary | ICD-10-CM | POA: Insufficient documentation

## 2012-10-28 LAB — COMPREHENSIVE METABOLIC PANEL
Alkaline Phosphatase: 112 U/L (ref 50–136)
Anion Gap: 12 (ref 7–16)
Bilirubin,Total: 0.4 mg/dL (ref 0.2–1.0)
Chloride: 93 mmol/L — ABNORMAL LOW (ref 98–107)
Co2: 23 mmol/L (ref 21–32)
Creatinine: 3.13 mg/dL — ABNORMAL HIGH (ref 0.60–1.30)
SGOT(AST): 36 U/L (ref 15–37)
SGPT (ALT): 21 U/L (ref 12–78)
Total Protein: 5.4 g/dL — ABNORMAL LOW (ref 6.4–8.2)

## 2012-10-28 LAB — CBC
HGB: 11.9 g/dL — ABNORMAL LOW (ref 13.0–18.0)
MCHC: 33.5 g/dL (ref 32.0–36.0)
MCV: 81 fL (ref 80–100)
RBC: 4.38 10*6/uL — ABNORMAL LOW (ref 4.40–5.90)
RDW: 16.9 % — ABNORMAL HIGH (ref 11.5–14.5)
WBC: 33.1 10*3/uL — ABNORMAL HIGH (ref 3.8–10.6)

## 2012-10-28 LAB — PROTIME-INR: Prothrombin Time: 50.1 secs — ABNORMAL HIGH (ref 11.5–14.7)

## 2012-11-16 DEATH — deceased

## 2012-12-02 ENCOUNTER — Telehealth: Payer: Self-pay | Admitting: Internal Medicine

## 2012-12-02 ENCOUNTER — Encounter: Payer: Self-pay | Admitting: Internal Medicine

## 2012-12-02 NOTE — Telephone Encounter (Signed)
12-02-12 sent past letter, pt due to see taylor 07-2011, ok to schedule with brooke/mt

## 2013-05-09 ENCOUNTER — Encounter: Payer: Self-pay | Admitting: *Deleted

## 2013-05-31 ENCOUNTER — Telehealth: Payer: Self-pay | Admitting: Internal Medicine

## 2013-05-31 ENCOUNTER — Encounter: Payer: Self-pay | Admitting: Internal Medicine

## 2013-05-31 NOTE — Telephone Encounter (Signed)
05-31-13 SENT PAST DUE LETTER, PT NEEDS PHYS DEFIB CK WITH TAYLOR/MT

## 2013-07-17 ENCOUNTER — Encounter: Payer: Self-pay | Admitting: Cardiology
